# Patient Record
Sex: Female | Born: 1995 | Race: White | Hispanic: No | Marital: Single | Smoking: Never smoker
Health system: Southern US, Community
[De-identification: ages and names within clinical notes are randomized; demographics above are authoritative.]

## PROBLEM LIST (undated history)

## (undated) DIAGNOSIS — B009 Herpesviral infection, unspecified: Secondary | ICD-10-CM

## (undated) HISTORY — DX: Herpesviral infection, unspecified: B00.9

---

## 2019-04-27 ENCOUNTER — Encounter: Payer: Self-pay | Admitting: Obstetrics and Gynecology

## 2019-05-16 ENCOUNTER — Other Ambulatory Visit: Payer: Self-pay

## 2019-05-17 ENCOUNTER — Telehealth: Payer: Self-pay | Admitting: *Deleted

## 2019-05-17 ENCOUNTER — Other Ambulatory Visit (HOSPITAL_COMMUNITY)
Admission: RE | Admit: 2019-05-17 | Discharge: 2019-05-17 | Disposition: A | Discharge status: Home / Self Care | Payer: BC Managed Care – PPO | Source: Ambulatory Visit | Attending: Obstetrics and Gynecology | Admitting: Obstetrics and Gynecology

## 2019-05-17 ENCOUNTER — Encounter: Payer: Self-pay | Admitting: Obstetrics and Gynecology

## 2019-05-17 ENCOUNTER — Other Ambulatory Visit: Payer: BC Managed Care – PPO

## 2019-05-17 ENCOUNTER — Ambulatory Visit (INDEPENDENT_AMBULATORY_CARE_PROVIDER_SITE_OTHER): Payer: BC Managed Care – PPO | Admitting: Obstetrics and Gynecology

## 2019-05-17 VITALS — BP 104/68 | HR 64 | Temp 97.9°F | Ht 65.5 in | Wt 123.0 lb

## 2019-05-17 DIAGNOSIS — N6321 Unspecified lump in the left breast, upper outer quadrant: Secondary | ICD-10-CM

## 2019-05-17 DIAGNOSIS — Z30431 Encounter for routine checking of intrauterine contraceptive device: Secondary | ICD-10-CM

## 2019-05-17 DIAGNOSIS — Z23 Encounter for immunization: Secondary | ICD-10-CM

## 2019-05-17 DIAGNOSIS — Z124 Encounter for screening for malignant neoplasm of cervix: Secondary | ICD-10-CM | POA: Insufficient documentation

## 2019-05-17 DIAGNOSIS — Z113 Encounter for screening for infections with a predominantly sexual mode of transmission: Secondary | ICD-10-CM

## 2019-05-17 DIAGNOSIS — Z0000 Encounter for general adult medical examination without abnormal findings: Secondary | ICD-10-CM | POA: Diagnosis not present

## 2019-05-17 DIAGNOSIS — Z01419 Encounter for gynecological examination (general) (routine) without abnormal findings: Secondary | ICD-10-CM

## 2019-05-17 DIAGNOSIS — Z803 Family history of malignant neoplasm of breast: Secondary | ICD-10-CM

## 2019-05-17 NOTE — Telephone Encounter (Signed)
Spoke with Deanna Lopez at [facility]. Patient scheduled for left breast US on 05/19/19 at 2:30pm, arrive at 2:10pm. Patient declined earlier appt offered.   Patient placed in [facility] hold.    Patient notified of appt date and time. Patient declines appt, she will contact [facility] directly to reschedule left breast US.    Routing to provider for final review. Patient is agreeable to disposition. Will close encounter.

## 2019-05-17 NOTE — Telephone Encounter (Signed)
-----   Message from Salvadore Dom, MD sent at 05/17/2019 10:18 AM EST ----- Please set her up for a left breast ultrasound, attention 1-2 o'clock near the periphery. 2.5 cm smooth mobile lump. Thanks, Sharee Pimple

## 2019-05-17 NOTE — Patient Instructions (Signed)
EXERCISE AND DIET:  We recommended that you start or continue a regular exercise program for good health. Regular exercise means any activity that makes your heart beat faster and makes you sweat.  We recommend exercising at least 30 minutes per day at least 3 days a week, preferably 4 or 5.  We also recommend a diet low in fat and sugar.  Inactivity, poor dietary choices and obesity can cause diabetes, heart attack, stroke, and kidney damage, among others.    ALCOHOL AND SMOKING:  Women should limit their alcohol intake to no more than 7 drinks/beers/glasses of wine (combined, not each!) per week. Moderation of alcohol intake to this level decreases your risk of breast cancer and liver damage. And of course, no recreational drugs are part of a healthy lifestyle.  And absolutely no smoking or even second hand smoke. Most people know smoking can cause heart and lung diseases, but did you know it also contributes to weakening of your bones? Aging of your skin?  Yellowing of your teeth and nails?  CALCIUM AND VITAMIN D:  Adequate intake of calcium and Vitamin D are recommended.  The recommendations for exact amounts of these supplements seem to change often, but generally speaking 1,000 mg of calcium (between diet and supplement) and 800 units of Vitamin D per day seems prudent. Certain women may benefit from higher intake of Vitamin D.  If you are among these women, your doctor will have told you during your visit.    PAP SMEARS:  Pap smears, to check for cervical cancer or precancers,  have traditionally been done yearly, although recent scientific advances have shown that most women can have pap smears less often.  However, every woman still should have a physical exam from her gynecologist every year. It will include a breast check, inspection of the vulva and vagina to check for abnormal growths or skin changes, a visual exam of the cervix, and then an exam to evaluate the size and shape of the uterus and  ovaries.  And after 24 years of age, a rectal exam is indicated to check for rectal cancers. We will also provide age appropriate advice regarding health maintenance, like when you should have certain vaccines, screening for sexually transmitted diseases, bone density testing, colonoscopy, mammograms, etc.   MAMMOGRAMS:  All women over 40 years old should have a yearly mammogram. Many facilities now offer a "3D" mammogram, which may cost around $50 extra out of pocket. If possible,  we recommend you accept the option to have the 3D mammogram performed.  It both reduces the number of women who will be called back for extra views which then turn out to be normal, and it is better than the routine mammogram at detecting truly abnormal areas.    COLON CANCER SCREENING: Now recommend starting at age 45. At this time colonoscopy is not covered for routine screening until 50. There are take home tests that can be done between 45-49.   COLONOSCOPY:  Colonoscopy to screen for colon cancer is recommended for all women at age 50.  We know, you hate the idea of the prep.  We agree, BUT, having colon cancer and not knowing it is worse!!  Colon cancer so often starts as a polyp that can be seen and removed at colonscopy, which can quite literally save your life!  And if your first colonoscopy is normal and you have no family history of colon cancer, most women don't have to have it again for   10 years.  Once every ten years, you can do something that may end up saving your life, right?  We will be happy to help you get it scheduled when you are ready.  Be sure to check your insurance coverage so you understand how much it will cost.  It may be covered as a preventative service at no cost, but you should check your particular policy.      Breast Self-Awareness Breast self-awareness means being familiar with how your breasts look and feel. It involves checking your breasts regularly and reporting any changes to your  health care provider. Practicing breast self-awareness is important. A change in your breasts can be a sign of a serious medical problem. Being familiar with how your breasts look and feel allows you to find any problems early, when treatment is more likely to be successful. All women should practice breast self-awareness, including women who have had breast implants. How to do a breast self-exam One way to learn what is normal for your breasts and whether your breasts are changing is to do a breast self-exam. To do a breast self-exam: Look for Changes  1. Remove all the clothing above your waist. 2. Stand in front of a mirror in a room with good lighting. 3. Put your hands on your hips. 4. Push your hands firmly downward. 5. Compare your breasts in the mirror. Look for differences between them (asymmetry), such as: ? Differences in shape. ? Differences in size. ? Puckers, dips, and bumps in one breast and not the other. 6. Look at each breast for changes in your skin, such as: ? Redness. ? Scaly areas. 7. Look for changes in your nipples, such as: ? Discharge. ? Bleeding. ? Dimpling. ? Redness. ? A change in position. Feel for Changes Carefully feel your breasts for lumps and changes. It is best to do this while lying on your back on the floor and again while sitting or standing in the shower or tub with soapy water on your skin. Feel each breast in the following way:  Place the arm on the side of the breast you are examining above your head.  Feel your breast with the other hand.  Start in the nipple area and make  inch (2 cm) overlapping circles to feel your breast. Use the pads of your three middle fingers to do this. Apply light pressure, then medium pressure, then firm pressure. The light pressure will allow you to feel the tissue closest to the skin. The medium pressure will allow you to feel the tissue that is a little deeper. The firm pressure will allow you to feel the tissue  close to the ribs.  Continue the overlapping circles, moving downward over the breast until you feel your ribs below your breast.  Move one finger-width toward the center of the body. Continue to use the  inch (2 cm) overlapping circles to feel your breast as you move slowly up toward your collarbone.  Continue the up and down exam using all three pressures until you reach your armpit.  Write Down What You Find  Write down what is normal for each breast and any changes that you find. Keep a written record with breast changes or normal findings for each breast. By writing this information down, you do not need to depend only on memory for size, tenderness, or location. Write down where you are in your menstrual cycle, if you are still menstruating. If you are having trouble noticing differences   in your breasts, do not get discouraged. With time you will become more familiar with the variations in your breasts and more comfortable with the exam. How often should I examine my breasts? Examine your breasts every month. If you are breastfeeding, the best time to examine your breasts is after a feeding or after using a breast pump. If you menstruate, the best time to examine your breasts is 5-7 days after your period is over. During your period, your breasts are lumpier, and it may be more difficult to notice changes. When should I see my health care provider? See your health care provider if you notice:  A change in shape or size of your breasts or nipples.  A change in the skin of your breast or nipples, such as a reddened or scaly area.  Unusual discharge from your nipples.  A lump or thick area that was not there before.  Pain in your breasts.  Anything that concerns you.  

## 2019-05-17 NOTE — Progress Notes (Signed)
23 y.o. G0P0000 Single White or Caucasian Not Hispanic or Latino female here for annual exam.  She has a paragard IUD, inserted in 5/19.   Period Cycle (Days): 28 Period Duration (Days): 3-4 days Period Pattern: Regular Menstrual Flow: Heavy, Light Menstrual Control: Tampon, Thin pad Menstrual Control Change Freq (Hours): changing tampon/pad every 2 hours on heavy days, changing every 5 hours on lighter days Dysmenorrhea: (!) Mild Dysmenorrhea Symptoms: Cramping  She can saturate a super tampon in up to 2 hours.  Sexually active, no specific partner, uses condoms. No dyspareunia.  Patient's last menstrual period was 04/19/2019 (exact date).          Sexually active: Yes.    The current method of family planning is IUD.    Exercising: Yes.    running, yoga Smoker:  no  Health Maintenance: Pap:  2017 WNL per patient History of abnormal Pap:  no TDaP:  Unsure Gardasil: completed all 3   reports that she has never smoked. She has never used smokeless tobacco. She reports current alcohol use of about 3.0 standard drinks of alcohol per week. She reports that she does not use drugs. She is a Actor.   History reviewed. No pertinent past medical history.  History reviewed. No pertinent surgical history.  Current Outpatient Medications  Medication Sig Dispense Refill  . doxycycline (VIBRAMYCIN) 100 MG capsule Take 1 capsule by mouth daily.    . fexofenadine (ALLEGRA ALLERGY) 180 MG tablet Take 1 tablet by mouth daily.     No current facility-administered medications for this visit.    Family History  Problem Relation Age of Onset  . Breast cancer Maternal Grandmother   . Breast cancer Paternal Grandmother   PGM in her 107's, MGM died in her 85's. Mom and her sister are fine.   Review of Systems  Constitutional: Negative.   HENT: Negative.   Eyes: Negative.   Respiratory: Negative.   Cardiovascular: Negative.   Endocrine: Negative.   Genitourinary: Negative.    Musculoskeletal: Negative.   Skin: Negative.   Allergic/Immunologic: Negative.   Neurological: Negative.   Hematological: Negative.   Psychiatric/Behavioral: Negative.     Exam:   BP 104/68 (BP Location: Right Arm, Patient Position: Sitting, Cuff Size: Normal)   Pulse 64   Temp 97.9 F (36.6 C) (Skin)   Ht 5' 5.5" (1.664 m)   Wt 123 lb (55.8 kg)   LMP 04/19/2019 (Exact Date)   BMI 20.16 kg/m   Weight change: @WEIGHTCHANGE @ Height:   Height: 5' 5.5" (166.4 cm)  Ht Readings from Last 3 Encounters:  05/17/19 5' 5.5" (1.664 m)    General appearance: alert, cooperative and appears stated age Head: Normocephalic, without obvious abnormality, atraumatic Neck: no adenopathy, supple, symmetrical, trachea midline and thyroid normal to inspection and palpation Lungs: clear to auscultation bilaterally Cardiovascular: regular rate and rhythm Breasts: in the periphery of the left breast at 1-2 o'clock is a smooth, mobile ~2.5 cm lump. No other lumps, no skin changes. Bilateral nipple piercings  Abdomen: soft, non-tender; non distended,  no masses,  no organomegaly Extremities: extremities normal, atraumatic, no cyanosis or edema Skin: Skin color, texture, turgor normal. No rashes or lesions Lymph nodes: Cervical, supraclavicular, and axillary nodes normal. No abnormal inguinal nodes palpated Neurologic: Grossly normal   Pelvic: External genitalia:  no lesions              Urethra:  normal appearing urethra with no masses, tenderness or lesions  Bartholins and Skenes: normal                 Vagina: normal appearing vagina with normal color and discharge, no lesions              Cervix: no lesions and IUD string 5 cm               Bimanual Exam:  Uterus:  normal size, contour, position, consistency, mobility, non-tender and retroverted              Adnexa: no mass, fullness, tenderness               Rectovaginal: Confirms               Anus:  normal sphincter tone, no  lesions  Gae Dry chaperoned for the exam.  A:  Well Woman with normal exam  IUD check  Heavy cycles  Left breast lump  FH of breast cancer in both of her grandmothers, one was in her 52's  P:   Pap with GC/CT/Trich  Screening labs, STD testing  Discussed breast self exam  Discussed calcium and vit D intake  Ultrasound left breast  Recommended her mother consider speaking with a Dietitian.   Condom use encouraged  TDAP today

## 2019-05-18 LAB — CYTOLOGY - PAP
Chlamydia: NEGATIVE
Comment: NEGATIVE
Comment: NEGATIVE
Comment: NORMAL
Neisseria Gonorrhea: NEGATIVE
Trichomonas: NEGATIVE

## 2019-05-18 LAB — COMPREHENSIVE METABOLIC PANEL
ALT: 11 IU/L (ref 0–32)
AST: 13 IU/L (ref 0–40)
Albumin/Globulin Ratio: 1.9 (ref 1.2–2.2)
Albumin: 4.7 g/dL (ref 3.9–5.0)
Alkaline Phosphatase: 47 IU/L (ref 39–117)
BUN/Creatinine Ratio: 16 (ref 9–23)
BUN: 12 mg/dL (ref 6–20)
Bilirubin Total: 0.7 mg/dL (ref 0.0–1.2)
CO2: 24 mmol/L (ref 20–29)
Calcium: 9.5 mg/dL (ref 8.7–10.2)
Chloride: 102 mmol/L (ref 96–106)
Creatinine, Ser: 0.77 mg/dL (ref 0.57–1.00)
GFR calc Af Amer: 125 mL/min/{1.73_m2} (ref >59)
GFR calc non Af Amer: 108 mL/min/{1.73_m2} (ref >59)
Globulin, Total: 2.5 g/dL (ref 1.5–4.5)
Glucose: 75 mg/dL (ref 65–99)
Potassium: 3.6 mmol/L (ref 3.5–5.2)
Sodium: 139 mmol/L (ref 134–144)
Total Protein: 7.2 g/dL (ref 6.0–8.5)

## 2019-05-18 LAB — CBC
Hematocrit: 38.1 % (ref 34.0–46.6)
Hemoglobin: 13 g/dL (ref 11.1–15.9)
MCH: 32.9 pg (ref 26.6–33.0)
MCHC: 34.1 g/dL (ref 31.5–35.7)
MCV: 97 fL (ref 79–97)
Platelets: 227 10*3/uL (ref 150–450)
RBC: 3.95 x10E6/uL (ref 3.77–5.28)
RDW: 11.2 % — ABNORMAL LOW (ref 11.7–15.4)
WBC: 4.1 10*3/uL (ref 3.4–10.8)

## 2019-05-18 LAB — LIPID PANEL
Chol/HDL Ratio: 2.3 ratio (ref 0.0–4.4)
Cholesterol, Total: 130 mg/dL (ref 100–199)
HDL: 56 mg/dL (ref >39)
LDL Chol Calc (NIH): 65 mg/dL (ref 0–99)
Triglycerides: 37 mg/dL (ref 0–149)
VLDL Cholesterol Cal: 9 mg/dL (ref 5–40)

## 2019-05-18 LAB — HIV ANTIBODY (ROUTINE TESTING W REFLEX): HIV Screen 4th Generation wRfx: NONREACTIVE

## 2019-05-18 LAB — RPR, QUANT+TP ABS (REFLEX)
Rapid Plasma Reagin, Quant: 1:1 {titer} — ABNORMAL HIGH
T Pallidum Abs: NONREACTIVE

## 2019-05-18 LAB — RPR: RPR Ser Ql: REACTIVE — AB

## 2019-05-19 ENCOUNTER — Other Ambulatory Visit: Payer: BC Managed Care – PPO

## 2019-05-20 ENCOUNTER — Other Ambulatory Visit: Payer: Self-pay

## 2019-05-20 DIAGNOSIS — A530 Latent syphilis, unspecified as early or late: Secondary | ICD-10-CM

## 2019-05-20 NOTE — Progress Notes (Addendum)
RPR orders placed for repeat for false + RPR on 06/06/2019.  RPR cancelled on 05/24/19 and reordered T. Pallidum AB total to be drawn on 06/06/2019.

## 2019-05-24 NOTE — Progress Notes (Signed)
It needs to be a T. Pallidum Ab. Please cancel the rpr and order the T. Pallidum Ab

## 2019-05-24 NOTE — Addendum Note (Signed)
Addended by: Georgia Lopes on: 05/24/2019 01:19 PM   Modules accepted: Orders

## 2019-06-02 ENCOUNTER — Other Ambulatory Visit: Payer: BC Managed Care – PPO

## 2019-06-06 ENCOUNTER — Ambulatory Visit: Payer: BC Managed Care – PPO | Admitting: Obstetrics and Gynecology

## 2019-06-06 ENCOUNTER — Other Ambulatory Visit: Payer: Self-pay

## 2019-06-06 ENCOUNTER — Other Ambulatory Visit (INDEPENDENT_AMBULATORY_CARE_PROVIDER_SITE_OTHER): Payer: BC Managed Care – PPO

## 2019-06-06 ENCOUNTER — Ambulatory Visit
Admission: RE | Admit: 2019-06-06 | Discharge: 2019-06-06 | Disposition: A | Discharge status: Home / Self Care | Payer: BC Managed Care – PPO | Source: Ambulatory Visit | Attending: Obstetrics and Gynecology | Admitting: Obstetrics and Gynecology

## 2019-06-06 DIAGNOSIS — N6321 Unspecified lump in the left breast, upper outer quadrant: Secondary | ICD-10-CM

## 2019-06-06 DIAGNOSIS — A530 Latent syphilis, unspecified as early or late: Secondary | ICD-10-CM

## 2019-06-06 DIAGNOSIS — Z803 Family history of malignant neoplasm of breast: Secondary | ICD-10-CM

## 2019-06-07 LAB — T.PALLIDUM AB, TOTAL: T Pallidum Abs: NONREACTIVE

## 2019-08-17 ENCOUNTER — Other Ambulatory Visit: Payer: Self-pay

## 2019-08-17 ENCOUNTER — Ambulatory Visit: Payer: BC Managed Care – PPO | Admitting: Obstetrics and Gynecology

## 2019-08-17 ENCOUNTER — Encounter: Payer: Self-pay | Admitting: Obstetrics and Gynecology

## 2019-08-17 VITALS — BP 118/60 | HR 81 | Temp 99.3°F | Ht 65.0 in | Wt 118.2 lb

## 2019-08-17 DIAGNOSIS — Z1239 Encounter for other screening for malignant neoplasm of breast: Secondary | ICD-10-CM | POA: Diagnosis not present

## 2019-08-17 NOTE — Progress Notes (Signed)
GYNECOLOGY  VISIT   HPI: 24 y.o.   Single White or Caucasian Not Hispanic or Latino  female   G0P0000 with No LMP recorded.   here for follow up 3 month left breast check. She states that she has some breast tenderness but thinks it's because her period is about to come.  At the time of her annual exam in 12/20, she was noted to have an ~2.5 cm lump in the periphery of the left breast at 1-2 o'clock. In 1/21 she had a normal breast ultrasound.  GYNECOLOGIC HISTORY: No LMP recorded. Contraception:IUD  Menopausal hormone therapy: none        OB History    Gravida  0   Para  0   Term  0   Preterm  0   AB  0   Living  0     SAB  0   TAB  0   Ectopic  0   Multiple  0   Live Births  0              There are no problems to display for this patient.   No past medical history on file.  No past surgical history on file.  Current Outpatient Medications  Medication Sig Dispense Refill  . doxycycline (VIBRAMYCIN) 100 MG capsule Take 1 capsule by mouth daily.    . fexofenadine (ALLEGRA ALLERGY) 180 MG tablet Take 1 tablet by mouth daily.     No current facility-administered medications for this visit.     ALLERGIES: Other  Family History  Problem Relation Age of Onset  . Breast cancer Maternal Grandmother   . Breast cancer Paternal Grandmother     Social History   Socioeconomic History  . Marital status: Single    Spouse name: Not on file  . Number of children: Not on file  . Years of education: Not on file  . Highest education level: Not on file  Occupational History  . Not on file  Tobacco Use  . Smoking status: Never Smoker  . Smokeless tobacco: Never Used  Substance and Sexual Activity  . Alcohol use: Yes    Alcohol/week: 3.0 standard drinks    Types: 3 Standard drinks or equivalent per week  . Drug use: Never  . Sexual activity: Yes    Birth control/protection: Condom, I.U.D.    Comment: Paragard IUD, inserted 09/2017  Other Topics Concern   . Not on file  Social History Narrative  . Not on file   Social Determinants of Health   Financial Resource Strain:   . Difficulty of Paying Living Expenses:   Food Insecurity:   . Worried About Programme researcher, broadcasting/film/video in the Last Year:   . Barista in the Last Year:   Transportation Needs:   . Freight forwarder (Medical):   Marland Kitchen Lack of Transportation (Non-Medical):   Physical Activity:   . Days of Exercise per Week:   . Minutes of Exercise per Session:   Stress:   . Feeling of Stress :   Social Connections:   . Frequency of Communication with Friends and Family:   . Frequency of Social Gatherings with Friends and Family:   . Attends Religious Services:   . Active Member of Clubs or Organizations:   . Attends Banker Meetings:   Marland Kitchen Marital Status:   Intimate Partner Violence:   . Fear of Current or Ex-Partner:   . Emotionally Abused:   Marland Kitchen Physically Abused:   .  Sexually Abused:     Review of Systems  Constitutional: Negative.   HENT: Negative.   Eyes: Negative.   Respiratory: Negative.   Cardiovascular: Negative.   Gastrointestinal: Negative.   Genitourinary: Negative.   Musculoskeletal: Negative.   Skin: Negative.   Neurological: Negative.   Endo/Heme/Allergies: Negative.   Psychiatric/Behavioral: Negative.   All other systems reviewed and are negative.   PHYSICAL EXAMINATION:    There were no vitals taken for this visit.    General appearance: alert, cooperative and appears stated age Breasts: normal appearance, in the upper outer quadrant of the left breast is more prominant fibroius tissue ~2.5 cm in size not felt on the other side. No change. No adenopathy, no skin changes.  ASSESSMENT Breast check, stable exam, normal imaging     PLAN Continue monthly breast self exams F/U in 12/21 for an annual exam   An After Visit Summary was printed and given to the patient.

## 2019-10-17 ENCOUNTER — Telehealth: Payer: Self-pay

## 2019-10-17 NOTE — Telephone Encounter (Signed)
AEX 05/2019 Paragard IUD  LMP 10/17/19 Recent frequent UTIs  Spoke with pt. Pt states having UTI sx that started today with LMP. Pt states having frequency, urgency, burning with urination and pain. Pt denies fever, chills, back pain, vaginal discharge or odor. Pt denies taking any OTC meds for treatment or pain. Advised can take Motrin or Tylenol for discomfort. Pt agreeable. Pt states [facility] and was given [facility]bid Rx and finished taking x 1 week ago.  Pt advised to be seen for further evaluation. Pt agreeable. Pt scheduled with Dr Oscar La 10/18/19 at 1:30 pm per pt's request of date and time due to work schedule. Pt agreeable and verbalized understanding. Pt advised can be seen at urgent care or PCP. Pt declines and will wait for OV.   Routing to Dr Oscar La for review.  Encounter closed.

## 2019-10-17 NOTE — Telephone Encounter (Signed)
Patient is calling in regards to a UTI. Patient stated she had a UTI 2 weeks ago and [facility]. Patient states she had completed the antibiotics but has started her cycle today and the UTI has returned. Patient stated it was very painful and would like to come in. Need triage to assist.

## 2019-10-18 ENCOUNTER — Other Ambulatory Visit: Payer: Self-pay

## 2019-10-18 ENCOUNTER — Ambulatory Visit: Payer: BC Managed Care – PPO | Admitting: Obstetrics and Gynecology

## 2019-10-18 ENCOUNTER — Encounter: Payer: Self-pay | Admitting: Obstetrics and Gynecology

## 2019-10-18 VITALS — BP 90/58 | HR 73 | Temp 98.4°F | Ht 65.0 in | Wt 115.0 lb

## 2019-10-18 DIAGNOSIS — R300 Dysuria: Secondary | ICD-10-CM

## 2019-10-18 DIAGNOSIS — N3090 Cystitis, unspecified without hematuria: Secondary | ICD-10-CM | POA: Diagnosis not present

## 2019-10-18 LAB — POCT URINALYSIS DIPSTICK
Bilirubin, UA: NEGATIVE
Glucose, UA: NEGATIVE
Ketones, UA: NEGATIVE
Nitrite, UA: NEGATIVE
Protein, UA: POSITIVE — AB
Urobilinogen, UA: NEGATIVE E.U./dL — AB
pH, UA: 7 (ref 5.0–8.0)

## 2019-10-18 MED ORDER — PHENAZOPYRIDINE HCL 200 MG PO TABS: 200.0000 mg | ORAL_TABLET | Freq: Three times a day (TID) | ORAL | 0 refills | Status: DC | PRN

## 2019-10-18 MED ORDER — SULFAMETHOXAZOLE-TRIMETHOPRIM 800-160 MG PO TABS: 1.0000 | ORAL_TABLET | Freq: Two times a day (BID) | ORAL | 0 refills | Status: DC

## 2019-10-18 NOTE — Patient Instructions (Signed)

## 2019-10-18 NOTE — Progress Notes (Signed)
GYNECOLOGY  VISIT   HPI: 24 y.o.   Single White or Caucasian Not Hispanic or Latino  female   G0P0000 with No LMP recorded.   here for dysuria frequency. Patient states that her symptoms have been present since yesterday AM.   She c/o frequency, urgency and dysuria since yesterday. No fever, no back pain. Voiding small amounts.  She was treated for a UTI on 10/01/19 at urgent care, symptoms did resolve, culture returned as negative.   Sexually active, same partner since December, not using condoms.    GYNECOLOGIC HISTORY: No LMP recorded. Contraception: IUD Menopausal hormone therapy: none         OB History    Gravida  0   Para  0   Term  0   Preterm  0   AB  0   Living  0     SAB  0   TAB  0   Ectopic  0   Multiple  0   Live Births  0              There are no problems to display for this patient.   No past medical history on file.  No past surgical history on file.  Current Outpatient Medications  Medication Sig Dispense Refill  . doxycycline (VIBRAMYCIN) 100 MG capsule Take 1 capsule by mouth daily.    . fexofenadine (ALLEGRA ALLERGY) 180 MG tablet Take 1 tablet by mouth daily.     No current facility-administered medications for this visit.     ALLERGIES: Other  Family History  Problem Relation Age of Onset  . Breast cancer Maternal Grandmother   . Breast cancer Paternal Grandmother     Social History   Socioeconomic History  . Marital status: Single    Spouse name: Not on file  . Number of children: Not on file  . Years of education: Not on file  . Highest education level: Not on file  Occupational History  . Not on file  Tobacco Use  . Smoking status: Never Smoker  . Smokeless tobacco: Never Used  Substance and Sexual Activity  . Alcohol use: Yes    Alcohol/week: 3.0 standard drinks    Types: 3 Standard drinks or equivalent per week  . Drug use: Never  . Sexual activity: Yes    Birth control/protection: Condom, I.U.D.   Comment: Paragard IUD, inserted 09/2017  Other Topics Concern  . Not on file  Social History Narrative  . Not on file   Social Determinants of Health   Financial Resource Strain:   . Difficulty of Paying Living Expenses:   Food Insecurity:   . Worried About Programme researcher, broadcasting/film/video in the Last Year:   . Barista in the Last Year:   Transportation Needs:   . Freight forwarder (Medical):   Marland Kitchen Lack of Transportation (Non-Medical):   Physical Activity:   . Days of Exercise per Week:   . Minutes of Exercise per Session:   Stress:   . Feeling of Stress :   Social Connections:   . Frequency of Communication with Friends and Family:   . Frequency of Social Gatherings with Friends and Family:   . Attends Religious Services:   . Active Member of Clubs or Organizations:   . Attends Banker Meetings:   Marland Kitchen Marital Status:   Intimate Partner Violence:   . Fear of Current or Ex-Partner:   . Emotionally Abused:   Marland Kitchen Physically Abused:   .  Sexually Abused:     Review of Systems  Genitourinary: Positive for dysuria, frequency and urgency.  All other systems reviewed and are negative.   PHYSICAL EXAMINATION:    BP (!) 90/58   Pulse 73   Temp 98.4 F (36.9 C)   Ht 5\' 5"  (1.651 m)   Wt 115 lb (52.2 kg)   SpO2 99%   BMI 19.14 kg/m     General appearance: alert, cooperative and appears stated age Abdomen: soft, mild suprapubic tenderness; non distended, no masses,  no organomegaly CVA: not tender   Urine dip: moderate blood, 2+Leuk  ASSESSMENT Cystitis    PLAN Treat with bactrim and pyridium Send urine for ua, c&s We discussed STD testing, she declines at this time.    An After Visit Summary was printed and given to the patient.

## 2019-10-19 LAB — URINALYSIS, MICROSCOPIC ONLY
Bacteria, UA: NONE SEEN
Casts: NONE SEEN /lpf
RBC, Urine: >30 /hpf — AB (ref 0–2)

## 2019-10-20 ENCOUNTER — Telehealth: Payer: Self-pay

## 2019-10-20 LAB — URINE CULTURE

## 2019-10-20 NOTE — Telephone Encounter (Signed)
Spoke with pt. Pt given results on labs per Dr Oscar La. Pt agreeable and verbalized understanding.  Pt states feeling much better today after first two days of Bactrim DS Rx.   Will not send in new Rx for Amoxicillin. A Routing to Dr Oscar La for review. Encounter closed.

## 2019-10-20 NOTE — Telephone Encounter (Signed)
-----   Message from Romualdo Bolk, MD sent at 10/20/2019  3:45 PM EDT ----- Please let the patient know that she does have a UTI, 2 bacteria are identified. E coli is likely sensitive to Bactrim, but those results are still pending. The GBS could be sensitive to the Bactrim, but would definitely be sensitive to Amoxicillin 500 mg q 8 hours x 5 days. Unless she is feeling fine, please send that in. Please check on the final results tomorrow

## 2019-10-24 ENCOUNTER — Telehealth: Payer: Self-pay | Admitting: Obstetrics and Gynecology

## 2019-10-24 NOTE — Telephone Encounter (Signed)
Spoke with patient. Patient seen in office on 5/18/211 for UTI, tx with bactrim DS bid x3 days. Patient reports when she was notified of results she was feeling better. Patient reports she completed abx on 10/21/19, reports still feeling discomfort. Denies urinary frequency, urgency, dysuria, flank pain, fever/chills. Patient is requesting additional abx.   Advised patient I willl provide update to Dr. Oscar La and notify of recommendations. Advised patient Dr. Oscar La is out of the office today, response may not be immediate. Patient agreeable. Pharmacy confirmed.   Reviewed 10/18/19 UC results. Rx pended.   Dr. Oscar La -please advise on Amoxicillin 500 mg q8 hrs x5 days.

## 2019-10-24 NOTE — Telephone Encounter (Signed)
Patient was seen 10/18/19 and is still having uti symptoms.

## 2019-10-25 MED ORDER — AMOXICILLIN 500 MG PO CAPS: 500.0000 mg | ORAL_CAPSULE | Freq: Three times a day (TID) | ORAL | 0 refills | Status: AC

## 2019-10-25 NOTE — Telephone Encounter (Signed)
Call to patient, left detailed message, ok per dpr, name identified on voicemail. Advised per Dr. Oscar La. Rx for amoxicillin 500 mg caps take 1 cap PO q8hrs for 5 days,  has been sent to [facility] on [address]. Return call to office if any additional questions.   Encounter closed.

## 2019-10-25 NOTE — Telephone Encounter (Signed)
Refill sent. Please let the patient know. I would also have her stop her doxycycline while taking the amoxicillin,it can decrease the effectiveness.

## 2019-11-03 ENCOUNTER — Telehealth: Payer: Self-pay | Admitting: Obstetrics and Gynecology

## 2019-11-03 NOTE — Telephone Encounter (Signed)
Spoke back with pt after reviewing with Dr Oscar La. Pt agreeable to OV. Pt states cannot come today due to working until 8 pm tonight. Pt states has off tomorrow 6/4 and aware that Dr Oscar La not in office. Pt advised can be seen by other provider or PCP. Pt states ok to see other provider in our office due to not having PCP. Pt scheduled as work in appt with Dr Edward Jolly 6/4 at 1115 am. Pt agreeable and verbalized understanding.   Routing to Dr Edward Jolly for review.  Cc: Dr Oscar La for [facility]  Encounter closed.

## 2019-11-03 NOTE — Telephone Encounter (Signed)
Reviewed with Dr Oscar La, pt advised to have OV.   Left message for pt to return call to triage RN.

## 2019-11-03 NOTE — Telephone Encounter (Signed)
Patient was treated a few weeks ago for a uti and her symptoms have returned.

## 2019-11-03 NOTE — Telephone Encounter (Signed)
Spoke with pt. Pt seen in office on 10/18/19 for UTI. +UC for E Coli and GBS on 5/20 and was treated with Bactrim DS and then had additional abx of Amoxicillin on 10/25/19 and finished taking on 10/29/19.  Pt states waking up this am and feels like UTI has returned with discomfort, urgency and burning when urinating. Pt states has not taken any OTC meds. Pt denies any frequency, flank pain, dysuria, fever, chills. Pt states was SA x 1 week ago and no problems after. Pt states only drinking 16 oz of water today.   Advised pt will review with Dr Oscar La and get recommendations and return call to pt. Pt agreeable.   Routing to Dr Oscar La

## 2019-11-04 ENCOUNTER — Encounter: Payer: Self-pay | Admitting: Obstetrics and Gynecology

## 2019-11-04 ENCOUNTER — Other Ambulatory Visit: Payer: Self-pay

## 2019-11-04 ENCOUNTER — Ambulatory Visit: Payer: BC Managed Care – PPO | Admitting: Obstetrics and Gynecology

## 2019-11-04 ENCOUNTER — Other Ambulatory Visit (HOSPITAL_COMMUNITY)
Admission: RE | Admit: 2019-11-04 | Discharge: 2019-11-04 | Disposition: A | Discharge status: Home / Self Care | Payer: BC Managed Care – PPO | Source: Ambulatory Visit | Attending: Obstetrics and Gynecology | Admitting: Obstetrics and Gynecology

## 2019-11-04 VITALS — BP 110/70 | HR 55 | Temp 97.4°F | Ht 65.0 in | Wt 112.0 lb

## 2019-11-04 DIAGNOSIS — R8290 Unspecified abnormal findings in urine: Secondary | ICD-10-CM

## 2019-11-04 DIAGNOSIS — Z113 Encounter for screening for infections with a predominantly sexual mode of transmission: Secondary | ICD-10-CM | POA: Insufficient documentation

## 2019-11-04 DIAGNOSIS — R350 Frequency of micturition: Secondary | ICD-10-CM

## 2019-11-04 LAB — POCT URINALYSIS DIPSTICK
Bilirubin, UA: NEGATIVE
Glucose, UA: NEGATIVE
Ketones, UA: NEGATIVE
Nitrite, UA: NEGATIVE
Protein, UA: NEGATIVE
Urobilinogen, UA: 0.2 E.U./dL
pH, UA: 6 (ref 5.0–8.0)

## 2019-11-04 MED ORDER — NITROFURANTOIN MONOHYD MACRO 100 MG PO CAPS
100.0000 mg | ORAL_CAPSULE | Freq: Two times a day (BID) | ORAL | 0 refills | Status: DC
Start: 2019-11-04 — End: 2019-11-22

## 2019-11-04 NOTE — Patient Instructions (Signed)
Urinary Tract Infection, Adult A urinary tract infection (UTI) is an infection of any part of the urinary tract. The urinary tract includes:  The kidneys.  The ureters.  The bladder.  The urethra. These organs make, store, and get rid of pee (urine) in the body. What are the causes? This is caused by germs (bacteria) in your genital area. These germs grow and cause swelling (inflammation) of your urinary tract. What increases the risk? You are more likely to develop this condition if:  You have a small, thin tube (catheter) to drain pee.  You cannot control when you pee or poop (incontinence).  You are female, and: ? You use these methods to prevent pregnancy:  A medicine that kills sperm (spermicide).  A device that blocks sperm (diaphragm). ? You have low levels of a female hormone (estrogen). ? You are pregnant.  You have genes that add to your risk.  You are sexually active.  You take antibiotic medicines.  You have trouble peeing because of: ? A prostate that is bigger than normal, if you are female. ? A blockage in the part of your body that drains pee from the bladder (urethra). ? A kidney stone. ? A nerve condition that affects your bladder (neurogenic bladder). ? Not getting enough to drink. ? Not peeing often enough.  You have other conditions, such as: ? Diabetes. ? A weak disease-fighting system (immune system). ? Sickle cell disease. ? Gout. ? Injury of the spine. What are the signs or symptoms? Symptoms of this condition include:  Needing to pee right away (urgently).  Peeing often.  Peeing small amounts often.  Pain or burning when peeing.  Blood in the pee.  Pee that smells bad or not like normal.  Trouble peeing.  Pee that is cloudy.  Fluid coming from the vagina, if you are female.  Pain in the belly or lower back. Other symptoms include:  Throwing up (vomiting).  No urge to eat.  Feeling mixed up (confused).  Being tired  and grouchy (irritable).  A fever.  Watery poop (diarrhea). How is this treated? This condition may be treated with:  Antibiotic medicine.  Other medicines.  Drinking enough water. Follow these instructions at home:  Medicines  Take over-the-counter and prescription medicines only as told by your doctor.  If you were prescribed an antibiotic medicine, take it as told by your doctor. Do not stop taking it even if you start to feel better. General instructions  Make sure you: ? Pee until your bladder is empty. ? Do not hold pee for a long time. ? Empty your bladder after sex. ? Wipe from front to back after pooping if you are a female. Use each tissue one time when you wipe.  Drink enough fluid to keep your pee pale yellow.  Keep all follow-up visits as told by your doctor. This is important. Contact a doctor if:  You do not get better after 1-2 days.  Your symptoms go away and then come back. Get help right away if:  You have very bad back pain.  You have very bad pain in your lower belly.  You have a fever.  You are sick to your stomach (nauseous).  You are throwing up. Summary  A urinary tract infection (UTI) is an infection of any part of the urinary tract.  This condition is caused by germs in your genital area.  There are many risk factors for a UTI. These include having a small, thin   tube to drain pee and not being able to control when you pee or poop.  Treatment includes antibiotic medicines for germs.  Drink enough fluid to keep your pee pale yellow. This information is not intended to replace advice given to you by your health care provider. Make sure you discuss any questions you have with your health care provider. Document Revised: 05/06/2018 Document Reviewed: 11/26/2017 Elsevier Patient Education  2020 Elsevier Inc.  

## 2019-11-04 NOTE — Progress Notes (Signed)
GYNECOLOGY  VISIT   HPI: 24 y.o.   Single  Caucasian  female   G0P0000 with Patient's last menstrual period was 10/17/2019.   here for urinary frequency and urgency.   Having burning, irritation, and urgency.  Symptoms onset after intercourse. No vaginal discharge, itching, or odor.   Treated for E Coli and GBS UTI from 10/18/19 UC using Bactrim and Amoxicillin.  This followed treatment with Macro[facility] at [facility].  That UC was negative. Prior UTI was around Thanksgiving.   Sometimes intercourse prompts the infection.   No new partner.   Urine Dip: 1+WBCs, 1+RBCs  GYNECOLOGIC HISTORY: Patient's last menstrual period was 10/17/2019. Contraception: Paragard IUD 5/19 Menopausal hormone therapy: n/a Last mammogram:  n/a Last pap smear: 05-17-19 LSIL, 2017 normal per patient         OB History    Gravida  0   Para  0   Term  0   Preterm  0   AB  0   Living  0     SAB  0   TAB  0   Ectopic  0   Multiple  0   Live Births  0              There are no problems to display for this patient.   History reviewed. No pertinent past medical history.  History reviewed. No pertinent surgical history.  Current Outpatient Medications  Medication Sig Dispense Refill  . fexofenadine (ALLEGRA ALLERGY) 180 MG tablet Take 1 tablet by mouth daily.    . phenazopyridine (PYRIDIUM) 200 MG tablet Take 1 tablet (200 mg total) by mouth 3 (three) times daily as needed. (Patient not taking: Reported on 11/04/2019) 6 tablet 0   No current facility-administered medications for this visit.     ALLERGIES: Other  Family History  Problem Relation Age of Onset  . Breast cancer Maternal Grandmother   . Breast cancer Paternal Grandmother     Social History   Socioeconomic History  . Marital status: Single    Spouse name: Not on file  . Number of children: Not on file  . Years of education: Not on file  . Highest education level: Not on file  Occupational History  .  Not on file  Tobacco Use  . Smoking status: Never Smoker  . Smokeless tobacco: Never Used  Substance and Sexual Activity  . Alcohol use: Yes    Alcohol/week: 3.0 standard drinks    Types: 3 Standard drinks or equivalent per week  . Drug use: Never  . Sexual activity: Yes    Birth control/protection: Condom, I.U.D.    Comment: Paragard IUD, inserted 09/2017  Other Topics Concern  . Not on file  Social History Narrative  . Not on file   Social Determinants of Health   Financial Resource Strain:   . Difficulty of Paying Living Expenses:   Food Insecurity:   . Worried About Charity fundraiser in the Last Year:   . Arboriculturist in the Last Year:   Transportation Needs:   . Film/video editor (Medical):   Marland Kitchen Lack of Transportation (Non-Medical):   Physical Activity:   . Days of Exercise per Week:   . Minutes of Exercise per Session:   Stress:   . Feeling of Stress :   Social Connections:   . Frequency of Communication with Friends and Family:   . Frequency of Social Gatherings with Friends and Family:   . Attends  Religious Services:   . Active Member of Clubs or Organizations:   . Attends Banker Meetings:   Marland Kitchen Marital Status:   Intimate Partner Violence:   . Fear of Current or Ex-Partner:   . Emotionally Abused:   Marland Kitchen Physically Abused:   . Sexually Abused:     Review of Systems  Genitourinary: Positive for frequency and urgency.  All other systems reviewed and are negative.   PHYSICAL EXAMINATION:    BP 110/70   Pulse (!) 55   Temp (!) 97.4 F (36.3 C) (Temporal)   Ht 5\' 5"  (1.651 m)   Wt 112 lb (50.8 kg)   LMP 10/17/2019   BMI 18.64 kg/m     General appearance: alert, cooperative and appears stated age   Pelvic: External genitalia:  no lesions              Urethra:  normal appearing urethra with no masses, tenderness or lesions              Bartholins and Skenes: normal                 Vagina: normal appearing vagina with normal color  and discharge, no lesions              Cervix: no lesions.  IUD strings present.  Not initially visible but then palpable.  Repeat speculum exam revealed the IUD strings.                 Bimanual Exam:  Uterus:  normal size, contour, position, consistency, mobility, non-tender              Adnexa: no mass, fullness, tenderness            Chaperone was present for exam.  ASSESSMENT  ParaGard IUD.  Recurrent urinary tract infection.  Some postcoital infections.   PLAN  Urine micro and culture.  Macrobid 100 mg po bid x 7 days.  GC/CT/trich testing.  We discussed potential post coital abx prophylaxis.  FU with Dr. 10/19/2019 in 2 weeks.    An After Visit Summary was printed and given to the patient.  __15____ minutes face to face time of which over 50% was spent in counseling.

## 2019-11-05 LAB — URINALYSIS, MICROSCOPIC ONLY
Bacteria, UA: NONE SEEN
Casts: NONE SEEN /lpf
WBC, UA: >30 /hpf — AB (ref 0–5)

## 2019-11-07 LAB — CERVICOVAGINAL ANCILLARY ONLY
Chlamydia: NEGATIVE
Comment: NEGATIVE
Comment: NEGATIVE
Comment: NORMAL
Neisseria Gonorrhea: NEGATIVE
Trichomonas: NEGATIVE

## 2019-11-07 LAB — URINE CULTURE

## 2019-11-18 ENCOUNTER — Other Ambulatory Visit: Payer: Self-pay

## 2019-11-22 ENCOUNTER — Other Ambulatory Visit: Payer: Self-pay

## 2019-11-22 ENCOUNTER — Encounter: Payer: Self-pay | Admitting: Obstetrics and Gynecology

## 2019-11-22 ENCOUNTER — Ambulatory Visit: Payer: BC Managed Care – PPO | Admitting: Obstetrics and Gynecology

## 2019-11-22 VITALS — BP 90/60 | HR 65 | Temp 98.4°F | Ht 65.0 in | Wt 114.0 lb

## 2019-11-22 DIAGNOSIS — N390 Urinary tract infection, site not specified: Secondary | ICD-10-CM | POA: Diagnosis not present

## 2019-11-22 DIAGNOSIS — R300 Dysuria: Secondary | ICD-10-CM | POA: Diagnosis not present

## 2019-11-22 MED ORDER — NITROFURANTOIN MACROCRYSTAL 50 MG PO CAPS
ORAL_CAPSULE | ORAL | 2 refills | Status: DC
Start: 2019-11-22 — End: 2021-06-17

## 2019-11-22 NOTE — Patient Instructions (Signed)

## 2019-11-22 NOTE — Progress Notes (Signed)
GYNECOLOGY  VISIT   HPI: 24 y.o.   Single White or Caucasian Not Hispanic or Latino  female   G0P0000 with Patient's last menstrual period was 11/17/2019.   here for follow up for frequent urination. Patient states that she is not having the frequency but is still having they burning with urination.     Treated for E Coli and GBS UTI from 10/18/19 UC using Bactrim and Amoxicillin.  This followed treatment [facility].  That UC was negative. Prior UTI was around Thanksgiving.  She was seen again on 11/04/19 with persistent symptoms and treated with macrobid x 7 days. That urine did grow out e coli. She feels 90% better. No frequency or urgency. She is now having intermittent dysuria, it hurts at the end of her urinary stream and the discomfort can last 30 minutes (down from hours). The pain is a 5/10 in severity, down from a 10/10 in severity. The burning is now 1 x a day. Slowly getting better.  She feels like her bladder infections occur with intercourse.  No vaginal symptoms, no external burning, no consistent flank pain, no abdominal pain, no fevers.  She didn't drink caffeine for a week, has restarted.     She had negative testing for GC/CT/trich  GYNECOLOGIC HISTORY: Patient's last menstrual period was 11/17/2019. Contraception:IUD Menopausal hormone therapy: none        OB History    Gravida  0   Para  0   Term  0   Preterm  0   AB  0   Living  0     SAB  0   TAB  0   Ectopic  0   Multiple  0   Live Births  0              There are no problems to display for this patient.   No past medical history on file.  No past surgical history on file.  Current Outpatient Medications  Medication Sig Dispense Refill  . fexofenadine (ALLEGRA ALLERGY) 180 MG tablet Take 1 tablet by mouth daily.     No current facility-administered medications for this visit.     ALLERGIES: Patient has no active allergies.  Family History  Problem  Relation Age of Onset  . Breast cancer Maternal Grandmother   . Breast cancer Paternal Grandmother     Social History   Socioeconomic History  . Marital status: Single    Spouse name: Not on file  . Number of children: Not on file  . Years of education: Not on file  . Highest education level: Not on file  Occupational History  . Not on file  Tobacco Use  . Smoking status: Never Smoker  . Smokeless tobacco: Never Used  Vaping Use  . Vaping Use: Never used  Substance and Sexual Activity  . Alcohol use: Yes    Alcohol/week: 3.0 standard drinks    Types: 3 Standard drinks or equivalent per week  . Drug use: Never  . Sexual activity: Yes    Birth control/protection: Condom, I.U.D.    Comment: Paragard IUD, inserted 09/2017  Other Topics Concern  . Not on file  Social History Narrative  . Not on file   Social Determinants of Health   Financial Resource Strain:   . Difficulty of Paying Living Expenses:   Food Insecurity:   . Worried About Programme researcher, broadcasting/film/video in the Last Year:   . [employer]  in the Last Year:   Transportation Needs:   . Film/video editor (Medical):   Marland Kitchen Lack of Transportation (Non-Medical):   Physical Activity:   . Days of Exercise per Week:   . Minutes of Exercise per Session:   Stress:   . Feeling of Stress :   Social Connections:   . Frequency of Communication with Friends and Family:   . Frequency of Social Gatherings with Friends and Family:   . Attends Religious Services:   . Active Member of Clubs or Organizations:   . Attends Archivist Meetings:   Marland Kitchen Marital Status:   Intimate Partner Violence:   . Fear of Current or Ex-Partner:   . Emotionally Abused:   Marland Kitchen Physically Abused:   . Sexually Abused:     Review of Systems  Genitourinary: Positive for dysuria.  All other systems reviewed and are negative.   PHYSICAL EXAMINATION:    BP 90/60   Pulse 65   Temp 98.4 F (36.9 C)   Ht 5\' 5"  (1.651 m)   Wt 114 lb (51.7  kg)   LMP 11/17/2019   SpO2 99%   BMI 18.97 kg/m     General appearance: alert, cooperative and appears stated age Abdomen: soft, minimally tender in the periumbilical region, no SP tenderness; non distended, no masses,  no organomegaly CVA: not tender  Urine dip: negative  ASSESSMENT Recurrent UTI Current mild intermittent dysuria, continuing to improve since being treated for a UTI earlier this week. Urine dip is negative    PLAN Send urine for ua, c&s Hydrate well Will hold on antibiotics until her culture returns (unless her symptoms worsen) Will treat with prophylactic antibiotics with intercourse

## 2019-11-23 LAB — URINALYSIS, MICROSCOPIC ONLY
Bacteria, UA: NONE SEEN
Casts: NONE SEEN /lpf
RBC, Urine: NONE SEEN /hpf (ref 0–2)
WBC, UA: NONE SEEN /hpf (ref 0–5)

## 2019-11-24 LAB — URINE CULTURE

## 2020-02-14 ENCOUNTER — Other Ambulatory Visit: Payer: BC Managed Care – PPO

## 2020-02-14 ENCOUNTER — Other Ambulatory Visit: Payer: Self-pay

## 2020-02-14 DIAGNOSIS — Z20822 Contact with and (suspected) exposure to covid-19: Secondary | ICD-10-CM

## 2020-02-16 LAB — SARS-COV-2, NAA 2 DAY TAT

## 2020-02-16 LAB — NOVEL CORONAVIRUS, NAA: SARS-CoV-2, NAA: NOT DETECTED

## 2020-05-22 NOTE — Progress Notes (Signed)
24 y.o. G0P0000 Single White or Caucasian female here for annual exam.     During sex having a urgency to urinate. She has noticed this in the last 4 months. She does not have incontinence during sex but,  has to stop and urinate during sex about 1 time out of 4. Has tried to urinate before sex and also after. Does not have any urgency outside of sex. Urinates about 5 times a day. She is not having problems with UTI symptoms since taking Macrobid after sex, Takes Macrobid about once every 2 weeks.  Declines std testing  Boyfriend feels iud strings sometimes. She like the Paragard, no problems otherwise  Patient's last menstrual period was 05/02/2020 (exact date).          Sexually active: Yes.    The current method of family planning is IUD. paragard inserted 5/19    Exercising: Yes.    running & yoga Smoker:  no  Health Maintenance: Pap:  05-17-2019 LGSIL History of abnormal Pap:  yes MMG:  06-06-2019 left breast u/s birads 1:neg Colonoscopy:  none BMD:   none TDaP:  2020 Gardasil:   completed Covid-19: pfizer Hep C testing: not done    reports that she has never smoked. She has never used smokeless tobacco. She reports current alcohol use of about 3.0 standard drinks of alcohol per week. She reports that she does not use drugs.  History reviewed. No pertinent past medical history.  History reviewed. No pertinent surgical history.  Current Outpatient Medications  Medication Sig Dispense Refill   fexofenadine (ALLEGRA) 180 MG tablet Take 1 tablet by mouth daily.     nitrofurantoin (MACRODANTIN) 50 MG capsule Take one tablet po prn intercourse. 30 capsule 2   No current facility-administered medications for this visit.    Family History  Problem Relation Age of Onset   Breast cancer Maternal Grandmother    Breast cancer Paternal Grandmother     Review of Systems  Constitutional: Negative.   HENT: Negative.   Eyes: Negative.   Respiratory: Negative.    Cardiovascular: Negative.   Gastrointestinal: Negative.   Endocrine: Negative.   Genitourinary:       Occ discomfort during intercourse  Musculoskeletal: Negative.   Skin: Negative.   Allergic/Immunologic: Negative.   Neurological: Negative.   Hematological: Negative.   Psychiatric/Behavioral: Negative.     Exam:   BP 118/70    Pulse 68    Resp 16    Ht 5' 6.25" (1.683 m)    Wt 112 lb (50.8 kg)    LMP 05/02/2020 (Exact Date)    BMI 17.94 kg/m   Height: 5' 6.25" (168.3 cm)  General appearance: alert, cooperative and appears stated age, no acute distress Head: Normocephalic, without obvious abnormality Neck: no adenopathy, thyroid normal to inspection and palpation Lungs: clear to auscultation bilaterally Breasts: normal appearance, no masses or tenderness, mildy fibrocystic Heart: regular rate and rhythm Abdomen: soft, non-tender; no masses,  no organomegaly Extremities: extremities normal, no edema Skin: No rashes or lesions Lymph nodes: Cervical, supraclavicular, and axillary nodes normal. No abnormal inguinal nodes palpated Neurologic: Grossly normal   Pelvic: External genitalia:  no lesions              Urethra:  normal appearing urethra with no masses, tenderness or lesions              Bartholins and Skenes: normal  Vagina: normal appearing vagina, appropriate for age, bloody, no lesions              Cervix: neg cervical motion tenderness, no visible lesions, strings visible, long at 5cm, trimmed to 2.5 cm             Bimanual Exam:   Uterus:  normal size, contour, position, consistency, mobility, non-tender              Adnexa: no mass, fullness, tenderness                 Ladona Ridgel, CMA Chaperone was present for exam.  A:  Well Woman with normal exam  IUD check, strings trimmed  LGSIL last year  Bladder urgency during sex, Hx bladder infections (uses macrobid after sex)   P:   Pap :collected today, follow up pending results  Labs: Urine culture  sent today, UA normal  Medications: discussed use of OTC oxybutinin or azo during sex to see if it helps. Advised that will research some more about urgency during intercourse and get back to her when I notify about urine culture results.

## 2020-05-23 ENCOUNTER — Ambulatory Visit: Payer: BC Managed Care – PPO | Admitting: Nurse Practitioner

## 2020-05-23 ENCOUNTER — Other Ambulatory Visit: Payer: Self-pay

## 2020-05-23 ENCOUNTER — Other Ambulatory Visit (HOSPITAL_COMMUNITY)
Admission: RE | Admit: 2020-05-23 | Discharge: 2020-05-23 | Disposition: A | Discharge status: Home / Self Care | Payer: BC Managed Care – PPO | Source: Ambulatory Visit | Attending: Obstetrics and Gynecology | Admitting: Obstetrics and Gynecology

## 2020-05-23 ENCOUNTER — Encounter: Payer: Self-pay | Admitting: Nurse Practitioner

## 2020-05-23 VITALS — BP 118/70 | HR 68 | Resp 16 | Ht 66.25 in | Wt 112.0 lb

## 2020-05-23 DIAGNOSIS — Z01419 Encounter for gynecological examination (general) (routine) without abnormal findings: Secondary | ICD-10-CM | POA: Insufficient documentation

## 2020-05-23 DIAGNOSIS — R3915 Urgency of urination: Secondary | ICD-10-CM | POA: Diagnosis not present

## 2020-05-23 NOTE — Patient Instructions (Addendum)
We will send a urine culture to confirm that you do not have an underlying bladder infection. You may want to try azo or oxybutinin OTC before sex. I am going to do more research on this and send you a message with the urine culture results  Health Maintenance, Female Adopting a healthy lifestyle and getting preventive care are important in promoting health and wellness. Ask your health care provider about:  The right schedule for you to have regular tests and exams.  Things you can do on your own to prevent diseases and keep yourself healthy. What should I know about diet, weight, and exercise? Eat a healthy diet   Eat a diet that includes plenty of vegetables, fruits, low-fat dairy products, and lean protein.  Do not eat a lot of foods that are high in solid fats, added sugars, or sodium. Maintain a healthy weight Body mass index (BMI) is used to identify weight problems. It estimates body fat based on height and weight. Your health care provider can help determine your BMI and help you achieve or maintain a healthy weight. Get regular exercise Get regular exercise. This is one of the most important things you can do for your health. Most adults should:  Exercise for at least 150 minutes each week. The exercise should increase your heart rate and make you sweat (moderate-intensity exercise).  Do strengthening exercises at least twice a week. This is in addition to the moderate-intensity exercise.  Spend less time sitting. Even light physical activity can be beneficial. Watch cholesterol and blood lipids Have your blood tested for lipids and cholesterol at 25 years of age, then have this test every 5 years. Have your cholesterol levels checked more often if:  Your lipid or cholesterol levels are high.  You are older than 25 years of age.  You are at high risk for heart disease. What should I know about cancer screening? Depending on your health history and family history, you  may need to have cancer screening at various ages. This may include screening for:  Breast cancer.  Cervical cancer.  Colorectal cancer.  Skin cancer.  Lung cancer. What should I know about heart disease, diabetes, and high blood pressure? Blood pressure and heart disease  High blood pressure causes heart disease and increases the risk of stroke. This is more likely to develop in people who have high blood pressure readings, are of African descent, or are overweight.  Have your blood pressure checked: ? Every 3-5 years if you are 64-56 years of age. ? Every year if you are 61 years old or older. Diabetes Have regular diabetes screenings. This checks your fasting blood sugar level. Have the screening done:  Once every three years after age 8 if you are at a normal weight and have a low risk for diabetes.  More often and at a younger age if you are overweight or have a high risk for diabetes. What should I know about preventing infection? Hepatitis B If you have a higher risk for hepatitis B, you should be screened for this virus. Talk with your health care provider to find out if you are at risk for hepatitis B infection. Hepatitis C Testing is recommended for:  Everyone born from [birth year] through 1966.  Anyone with known risk factors for hepatitis C. Sexually transmitted infections (STIs)  Get screened for STIs, including gonorrhea and chlamydia, if: ? You are sexually active and are younger than 25 years of age. ? You are older than  25 years of age and your health care provider tells you that you are at risk for this type of infection. ? Your sexual activity has changed since you were last screened, and you are at increased risk for chlamydia or gonorrhea. Ask your health care provider if you are at risk.  Ask your health care provider about whether you are at high risk for HIV. Your health care provider may recommend a prescription medicine to help prevent HIV infection. If  you choose to take medicine to prevent HIV, you should first get tested for HIV. You should then be tested every 3 months for as long as you are taking the medicine. Pregnancy  If you are about to stop having your period (premenopausal) and you may become pregnant, seek counseling before you get pregnant.  Take 400 to 800 micrograms (mcg) of folic acid every day if you become pregnant.  Ask for birth control (contraception) if you want to prevent pregnancy. Osteoporosis and menopause Osteoporosis is a disease in which the bones lose minerals and strength with aging. This can result in bone fractures. If you are 70 years old or older, or if you are at risk for osteoporosis and fractures, ask your health care provider if you should:  Be screened for bone loss.  Take a calcium or vitamin D supplement to lower your risk of fractures.  Be given hormone replacement therapy (HRT) to treat symptoms of menopause. Follow these instructions at home: Lifestyle  Do not use any products that contain nicotine or tobacco, such as cigarettes, e-cigarettes, and chewing tobacco. If you need help quitting, ask your health care provider.  Do not use street drugs.  Do not share needles.  Ask your health care provider for help if you need support or information about quitting drugs. Alcohol use  Do not drink alcohol if: ? Your health care provider tells you not to drink. ? You are pregnant, may be pregnant, or are planning to become pregnant.  If you drink alcohol: ? Limit how much you use to 0-1 drink a day. ? Limit intake if you are breastfeeding.  Be aware of how much alcohol is in your drink. In the [place] one drink equals one 12 oz bottle of beer (355 mL), one 5 oz glass of wine (148 mL), or one 1 oz glass of hard liquor (44 mL). General instructions  Schedule regular health, dental, and eye exams.  Stay current with your vaccines.  Tell your health care provider if: ? You often feel  depressed. ? You have ever been abused or do not feel safe at home. Summary  Adopting a healthy lifestyle and getting preventive care are important in promoting health and wellness.  Follow your health care provider's instructions about healthy diet, exercising, and getting tested or screened for diseases.  Follow your health care provider's instructions on monitoring your cholesterol and blood pressure. This information is not intended to replace advice given to you by your health care provider. Make sure you discuss any questions you have with your health care provider. Document Revised: 05/12/2018 Document Reviewed: 05/12/2018 Elsevier Patient Education  2020 [employer].

## 2020-06-01 LAB — URINE CULTURE

## 2020-06-04 ENCOUNTER — Other Ambulatory Visit: Payer: BC Managed Care – PPO

## 2020-06-04 DIAGNOSIS — Z20822 Contact with and (suspected) exposure to covid-19: Secondary | ICD-10-CM

## 2020-06-05 LAB — NOVEL CORONAVIRUS, NAA: SARS-CoV-2, NAA: NOT DETECTED

## 2020-06-05 LAB — CYTOLOGY - PAP
Diagnosis: NEGATIVE
Diagnosis: REACTIVE

## 2020-06-05 LAB — SARS-COV-2, NAA 2 DAY TAT

## 2020-11-10 ENCOUNTER — Telehealth: Payer: BC Managed Care – PPO | Admitting: Nurse Practitioner

## 2020-11-10 ENCOUNTER — Encounter: Payer: Self-pay | Admitting: Nurse Practitioner

## 2020-11-10 DIAGNOSIS — F410 Panic disorder [episodic paroxysmal anxiety] without agoraphobia: Secondary | ICD-10-CM

## 2020-11-10 DIAGNOSIS — U071 COVID-19: Secondary | ICD-10-CM | POA: Diagnosis not present

## 2020-11-10 MED ORDER — BENZONATATE 100 MG PO CAPS: 100.0000 mg | ORAL_CAPSULE | Freq: Three times a day (TID) | ORAL | 0 refills | Status: DC | PRN

## 2020-11-10 MED ORDER — ALBUTEROL SULFATE HFA 108 (90 BASE) MCG/ACT IN AERS: 2.0000 | INHALATION_SPRAY | Freq: Four times a day (QID) | RESPIRATORY_TRACT | 0 refills | Status: DC | PRN

## 2020-11-10 NOTE — Progress Notes (Signed)
Deanna Lopez, Deanna Lopez are scheduled for a virtual visit with Mary-Margaret Kalyna Paolella< FNP today.    Just as we do with appointments in the office, we must obtain your consent to participate.  Your consent will be active for this visit and any virtual visit you may have with one of our providers in the next 365 days.    If you have a MyChart account, I can also send a copy of this consent to you electronically.  All virtual visits are billed to your insurance company just like a traditional visit in the office.  As this is a virtual visit, video technology does not allow for your provider to perform a traditional examination.  This may limit your provider's ability to fully assess your condition.  If your provider identifies any concerns that need to be evaluated in person or the need to arrange testing such as labs, EKG, etc, we will make arrangements to do so.    Although advances in technology are sophisticated, we cannot ensure that it will always work on either your end or our end.  If the connection with a video visit is poor, we may have to switch to a telephone visit.  With either a video or telephone visit, we are not always able to ensure that we have a secure connection.   I need to obtain your verbal consent now.   Are you willing to proceed with your visit today?   Deanna Lopez has provided verbal consent on 11/10/2020 for a virtual visit (video or telephone).  Virtual Visit via Video  I connected with  Deanna Lopez  on 11/10/20 at 7:00 by video and verified that I am speaking with the correct person using two identifiers. Deanna Lopez is currently located at home and no one is currently with  her during visit. The provider, Mary-Margaret Daphine Deutscher, FNP is located at home at time of visit.  I discussed the limitations, risks, security and privacy concerns of performing an evaluation and management service by video  and the availability of in person appointments. I also discussed with the  patient that there may be a patient responsible charge related to this service. The patient expressed understanding and agreed to proceed.   Subjective:   HPI:   Patient started having coivd symptoms on tuesday of last week. She was having cough, congestion and n=body aches. She did at home test Tuesday and as negative. Repated covid test on wednseday and was positive. She is all better except for slight cough. She says she has had several episodes today where she felt like she could not catch her breath and hands went numb.  Review of Systems  Constitutional:  Negative for chills and fever.  HENT:  Negative for congestion (resolves).   Respiratory:  Positive for cough. Negative for sputum production and shortness of breath.   Musculoskeletal:  Negative for myalgias.  Neurological:  Negative for headaches.    See pertinent positives and negatives per HPI.  There are no problems to display for this patient.   Social History   Tobacco Use   Smoking status: Never   Smokeless tobacco: Never  Substance Use Topics   Alcohol use: Yes    Alcohol/week: 3.0 standard drinks    Types: 3 Standard drinks or equivalent per week    Current Outpatient Medications:    fexofenadine (ALLEGRA) 180 MG tablet, Take 1 tablet by mouth daily., Disp: , Rfl:    nitrofurantoin (MACRODANTIN) 50 MG capsule, Take one tablet po prn  intercourse., Disp: 30 capsule, Rfl: 2  No Active Allergies  Objective:   There were no vitals taken for this visit.  Patient is well-developed, well-nourished in no acute distress.  Resting comfortably  at home.  Head is normocephalic, atraumatic.  No labored breathing.  Speech is clear and coherent with logical content.  Patient is alert and oriented at baseline.  No cough noted during visit  Assessment and Plan:        Deanna Lopez in today with chief complaint of No chief complaint on file.   1. Lab test positive for detection of COVID-19 virus Force  fluids Rest Meds ordered this encounter  Medications   benzonatate (TESSALON PERLES) 100 MG capsule    Sig: Take 1 capsule (100 mg total) by mouth 3 (three) times daily as needed.    Dispense:  20 capsule    Refill:  0    Order Specific Question:   Supervising Provider    Answer:   MILLER, BRIAN [[id number]]   albuterol (VENTOLIN HFA) 108 (90 Base) MCG/ACT inhaler    Sig: Inhale 2 puffs into the lungs every 6 (six) hours as needed for wheezing or shortness of breath.    Dispense:  8 g    Refill:  0    Order Specific Question:   Supervising Provider    Answer:   MILLER, BRIAN [[id number]]     2. Panic attack Stress management Deep breathing when occurs See PCP if does not improve.    The above assessment and management plan was discussed with the patient. The patient verbalized understanding of and has agreed to the management plan. Patient is aware to call the clinic if symptoms persist or worsen. Patient is aware when to return to the clinic for a follow-up visit. Patient educated on when it is appropriate to go to the emergency department.   Mary-Margaret Daphine Deutscher, FNP  .   Mary-Margaret Daphine Deutscher, FNP 11/10/2020  Time spent with the patient: 15 minutes, of which >50% was spent in obtaining information about symptoms, reviewing previous labs, evaluations, and treatments, counseling about condition (please see the discussed topics above), and developing a plan to further investigate it; had a number of questions which I addressed.

## 2021-03-02 IMAGING — US US BREAST*L* LIMITED INC AXILLA
1 series · 3 of 3 positions shown · non-contrast
Comparison: Previous exam(s).
COMPARISON: Previous exam(s).

Addendum:
CLINICAL DATA: Palpable lump in the left breast felt by the
patient's physician.

EXAM:
ULTRASOUND OF THE LEFT BREAST

[Series 1: us breast*left* limited inc axilla · 0.05mm/px · 3 of 3 slices shown]
[im 1/3]
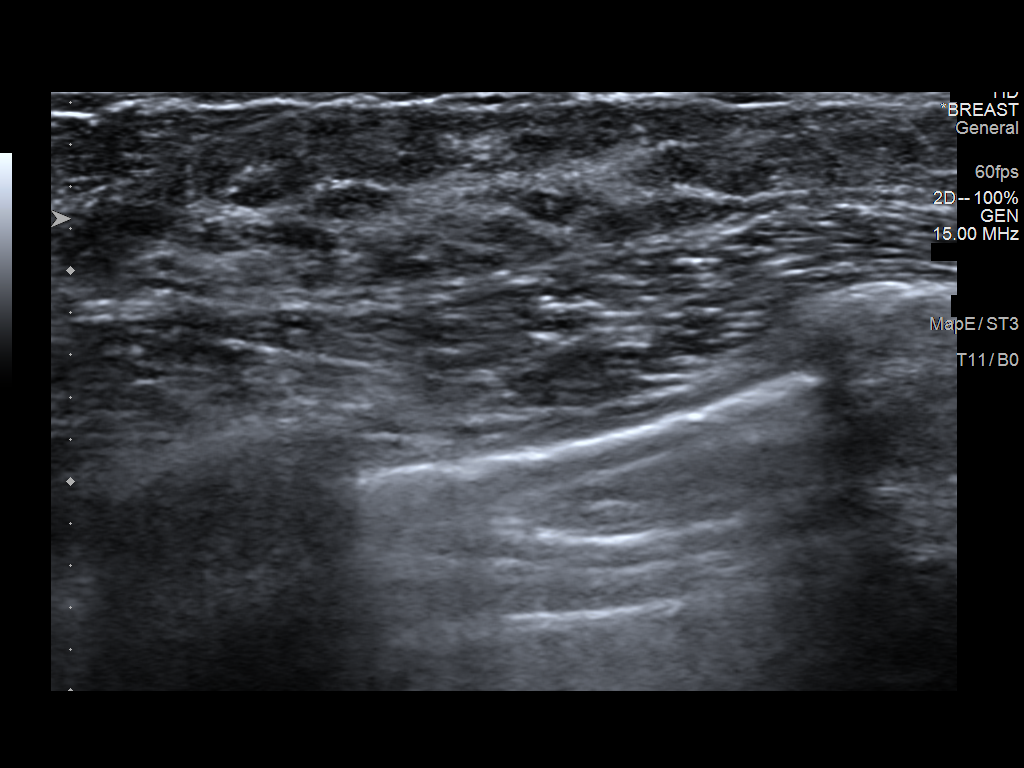
[im 2/3]
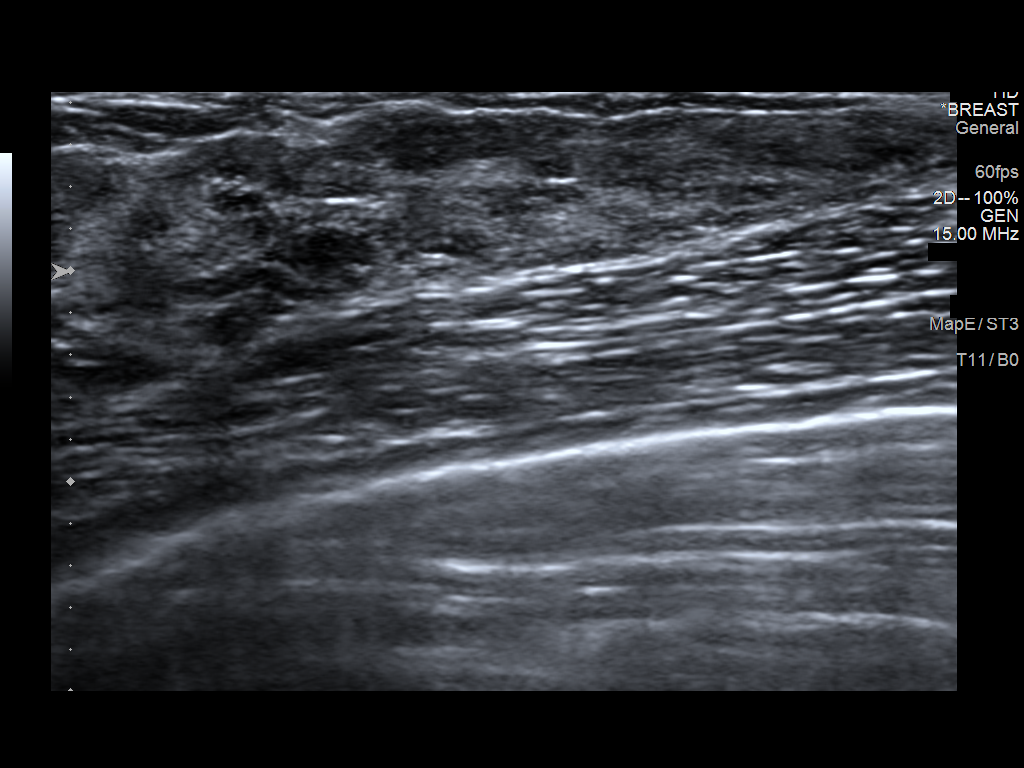
[im 3/3]
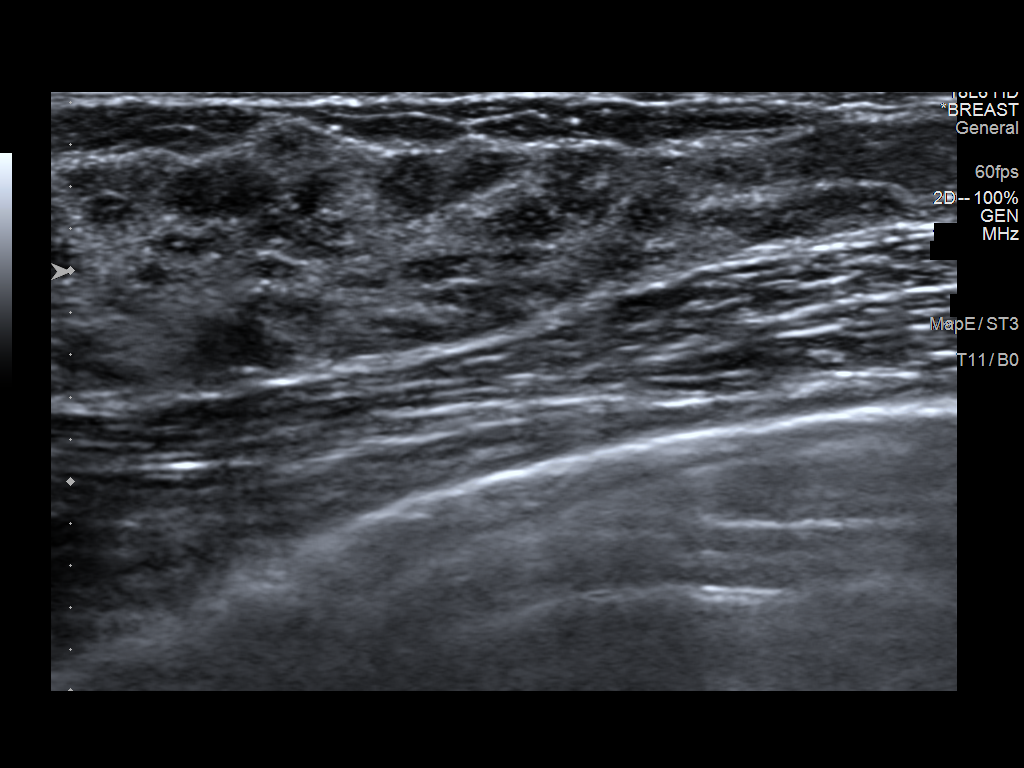

[3 of 3 positions shown; findings below may reference images not displayed]

FINDINGS: On physical exam, no suspicious lumps are identified.

Targeted ultrasound is performed, showing normal tissue. No mass
identified.
IMPRESSION: No sonographic evidence malignancy

RECOMMENDATION:
Treatment of lump should be based on clinical and physical exam
given lack of imaging findings. Recommend annual screening
mammography beginning.

I have discussed the findings and recommendations with the patient.
If applicable, a reminder letter will be sent to the patient
regarding the next appointment.

BI-RADS CATEGORY  1: Negative.

ADDENDUM:
Recommend annual screening mammography beginning at the age of 40.

*** End of Addendum ***
FINDINGS: On physical exam, no suspicious lumps are identified.

Targeted ultrasound is performed, showing normal tissue. No mass
identified.
IMPRESSION: No sonographic evidence malignancy

RECOMMENDATION:
Treatment of lump should be based on clinical and physical exam
given lack of imaging findings. Recommend annual screening
mammography beginning.

I have discussed the findings and recommendations with the patient.
If applicable, a reminder letter will be sent to the patient
regarding the next appointment.

BI-RADS CATEGORY  1: Negative.

## 2021-06-12 NOTE — Progress Notes (Signed)
26 y.o. G0P0000 Single White or Caucasian Not Hispanic or Latino female here for annual exam.  She has a paragard IUD, inserted in 5/19.  Sexually active, same partner x 2 years (don't live together).  Period Cycle (Days): 30 Period Duration (Days): 4 Period Pattern: Regular Menstrual Flow: Heavy Menstrual Control: Tampon, Panty liner Menstrual Control Change Freq (Hours): 2 Dysmenorrhea: (!) Mild Dysmenorrhea Symptoms: Cramping  Takes antibiotics with intercourse.   She has occasional deep dyspareunia, only 1/15 times which has improved. If it hurts, the pain will last for 30-60 minutes, up to a 7/10 in severity. It feels like a bladder pain, like a UTI.   Patient's last menstrual period was 05/27/2021.          Sexually active: Yes.    The current method of family planning is IUD.    Exercising: Yes.     Yoga and strength training  Smoker:  no  Health Maintenance: Pap: 05/23/20 WNL 05-17-2019 LGSIL History of abnormal Pap:  yes MMG:  U/S left breast 06/06/19 bi-rads 1 neg  BMD:   none  Colonoscopy: none  TDaP:  05/17/19  Gardasil: complete    reports that she has never smoked. She has never used smokeless tobacco. She reports current alcohol use of about 3.0 standard drinks per week. She reports that she does not use drugs. 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  History reviewed. No pertinent past medical history.  History reviewed. No pertinent surgical history.  Current Outpatient Medications  Medication Sig Dispense Refill   fexofenadine (ALLEGRA) 180 MG tablet Take 1 tablet by mouth daily.     nitrofurantoin (MACRODANTIN) 50 MG capsule Take one tablet po prn intercourse. 30 capsule 2   paragard intrauterine copper IUD IUD 1 each by Intrauterine route once.     No current facility-administered medications for this visit.    Family History  Problem Relation Age of Onset   Breast cancer Maternal Grandmother    Breast cancer  Paternal Grandmother     Review of Systems  All other systems reviewed and are negative.  Exam:   BP 124/68    Pulse 72    Ht 5\' 5"  (1.651 m)    Wt 113 lb (51.3 kg)    LMP 05/27/2021    SpO2 99%    BMI 18.80 kg/m   Weight change: @WEIGHTCHANGE @ Height:   Height: 5\' 5"  (165.1 cm)  Ht Readings from Last 3 Encounters:  06/17/21 5\' 5"  (1.651 m)  05/23/20 5' 6.25" (1.683 m)  11/22/19 5\' 5"  (1.651 m)    General appearance: alert, cooperative and appears stated age Head: Normocephalic, without obvious abnormality, atraumatic Neck: no adenopathy, supple, symmetrical, trachea midline and thyroid normal to inspection and palpation Lungs: clear to auscultation bilaterally Cardiovascular: regular rate and rhythm Breasts: normal appearance, no masses or tenderness Abdomen: soft, non-tender; non distended,  no masses,  no organomegaly Extremities: extremities normal, atraumatic, no cyanosis or edema Skin: Skin color, texture, turgor normal. No rashes or lesions Lymph nodes: Cervical, supraclavicular, and axillary nodes normal. No abnormal inguinal nodes palpated Neurologic: Grossly normal   Pelvic: External genitalia:  no lesions              Urethra:  normal appearing urethra with no masses, tenderness or lesions              Bartholins and Skenes: normal  Vagina: normal appearing vagina with normal color and discharge, no lesions              Cervix: no lesions and IUD strings 3 cm               Bimanual Exam:  Uterus:  normal size, contour, position, consistency, mobility, non-tender and retroverted              Adnexa: no mass, fullness, tenderness               Rectovaginal: Confirms               Anus:  normal sphincter tone, no lesions  Carolynn Serve chaperoned for the exam.  1. Well woman exam Discussed breast self exam Discussed calcium and vit D intake No labs this year  2. Screening for cervical cancer - Cytology - PAP  3. Recurrent UTI - nitrofurantoin  (MACRODANTIN) 50 MG capsule; Take one tablet po prn intercourse.  Dispense: 30 capsule; Refill: 2  4. History of cervical dysplasia - Cytology - PAP  5. IUD check up Doing well

## 2021-06-17 ENCOUNTER — Other Ambulatory Visit: Payer: Self-pay

## 2021-06-17 ENCOUNTER — Other Ambulatory Visit (HOSPITAL_COMMUNITY)
Admission: RE | Admit: 2021-06-17 | Discharge: 2021-06-17 | Disposition: A | Discharge status: Home / Self Care | Payer: BC Managed Care – PPO | Source: Ambulatory Visit | Attending: Obstetrics and Gynecology | Admitting: Obstetrics and Gynecology

## 2021-06-17 ENCOUNTER — Encounter: Payer: Self-pay | Admitting: Obstetrics and Gynecology

## 2021-06-17 ENCOUNTER — Ambulatory Visit (INDEPENDENT_AMBULATORY_CARE_PROVIDER_SITE_OTHER): Payer: BC Managed Care – PPO | Admitting: Obstetrics and Gynecology

## 2021-06-17 VITALS — BP 124/68 | HR 72 | Ht 65.0 in | Wt 113.0 lb

## 2021-06-17 DIAGNOSIS — Z124 Encounter for screening for malignant neoplasm of cervix: Secondary | ICD-10-CM | POA: Diagnosis present

## 2021-06-17 DIAGNOSIS — Z01419 Encounter for gynecological examination (general) (routine) without abnormal findings: Secondary | ICD-10-CM

## 2021-06-17 DIAGNOSIS — N390 Urinary tract infection, site not specified: Secondary | ICD-10-CM | POA: Diagnosis not present

## 2021-06-17 DIAGNOSIS — Z30431 Encounter for routine checking of intrauterine contraceptive device: Secondary | ICD-10-CM

## 2021-06-17 DIAGNOSIS — Z87410 Personal history of cervical dysplasia: Secondary | ICD-10-CM | POA: Insufficient documentation

## 2021-06-17 MED ORDER — NITROFURANTOIN MACROCRYSTAL 50 MG PO CAPS: ORAL_CAPSULE | ORAL | 2 refills | Status: DC

## 2021-06-17 NOTE — Patient Instructions (Signed)

## 2021-06-19 LAB — CYTOLOGY - PAP
Comment: NEGATIVE
Diagnosis: UNDETERMINED — AB
High risk HPV: NEGATIVE

## 2022-07-01 NOTE — Progress Notes (Signed)
27 y.o. G0P0000 Single White or Caucasian Not Hispanic or Latino female here for annual exam.  She has a paragard IUD, inserted in 5/19.  Period Cycle (Days): 28 Period Duration (Days): 3 Period Pattern: Regular Menstrual Flow: Heavy Menstrual Control: Tampon, Maxi pad Dysmenorrhea: (!) Moderate Dysmenorrhea Symptoms: Cramping She can saturate an ultra tampon in up to 2 hours for 1/2 of a day, then it slows down. Sexually active, no current partner.  She takes prophylactic macrodantin with intercourse secondary to a h/o recurrent UTI's.   LMP:  06/22/2022    Sexually active: Yes.    The current method of family planning is paragard IUD.    Exercising: Yes.    Yoga, weight lifting Smoker:  no  Health Maintenance: Pap: 06/17/21 ASCUS Hr HPV neg; 05/23/20 WNL; 05-17-2019 LGSIL History of abnormal Pap:  yes L Breast US:  bi-rads 1 neg  BMD:   n/a Colonoscopy: n/a TDaP:  05/17/19 Gardasil: complete    reports that she has never smoked. She has never been exposed to tobacco smoke. She has never used smokeless tobacco. She reports current alcohol use of about 3.0 standard drinks of alcohol per week. She reports that she does not use drugs. 6-7 drinks a week. 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    History reviewed. No pertinent past medical history.  History reviewed. No pertinent surgical history.  Current Outpatient Medications  Medication Sig Dispense Refill   fexofenadine (ALLEGRA) 180 MG tablet Take 1 tablet by mouth daily.     nitrofurantoin (MACRODANTIN) 50 MG capsule Take one tablet po prn intercourse. 30 capsule 2   paragard intrauterine copper IUD IUD 1 each by Intrauterine route once.     No current facility-administered medications for this visit.    Family History  Problem Relation Age of Onset   Breast cancer Maternal Grandmother    Breast cancer Paternal Grandmother     Review of Systems  All other systems reviewed and are negative.   Exam:   BP  (!) 98/58 (BP Location: Left Arm, Cuff Size: Normal)   Ht 5\' 5"  (1.651 m)   Wt 113 lb (51.3 kg)   LMP 06/22/2022 (Exact Date)   BMI 18.80 kg/m   Weight change: @WEIGHTCHANGE @ Height:   Height: 5\' 5"  (165.1 cm)  Ht Readings from Last 3 Encounters:  07/10/22 5\' 5"  (1.651 m)  06/17/21 5\' 5"  (1.651 m)  05/23/20 5' 6.25" (1.683 m)    General appearance: alert, cooperative and appears stated age Head: Normocephalic, without obvious abnormality, atraumatic Neck: no adenopathy, supple, symmetrical, trachea midline and thyroid normal to inspection and palpation Lungs: clear to auscultation bilaterally Cardiovascular: regular rate and rhythm Breasts: normal appearance, no masses or tenderness Abdomen: soft, non-tender; non distended,  no masses,  no organomegaly Extremities: extremities normal, atraumatic, no cyanosis or edema Skin: Skin color, texture, turgor normal. No rashes or lesions Lymph nodes: Cervical, supraclavicular, and axillary nodes normal. No abnormal inguinal nodes palpated Neurologic: Grossly normal   Pelvic: External genitalia:  no lesions              Urethra:  normal appearing urethra with no masses, tenderness or lesions              Bartholins and Skenes: normal                 Vagina: normal appearing vagina with normal color and discharge, no lesions  Cervix: no lesions and IUD strings 2-3 cm               Bimanual Exam:  Uterus:  normal size, contour, position, consistency, mobility, non-tender              Adnexa: no mass, fullness, tenderness               Rectovaginal: Confirms               Anus:  normal sphincter tone, no lesions  Santiago Glad, CMA chaperoned for the exam.  1. Well woman exam Discussed breast self exam Discussed calcium and vit D intake No screening labs this year  2. Screening for cervical cancer - Cytology - PAP  3. Recurrent UTI Controlled with medication - nitrofurantoin (MACRODANTIN) 50 MG capsule; Take one tablet po prn  intercourse.  Dispense: 30 capsule; Refill: 2  4. IUD check up Doing well, cycles are heavy, but tolerable  5. Screening examination for STD (sexually transmitted disease) - Cytology - PAP - RPR - HIV Antibody (routine testing w rflx) - Hepatitis C antibody - HSV(herpes simplex vrs) 1+2 ab-IgG  6. Menorrhagia with regular cycle Cycles are heavy for 1/2 a day with the paragard IUD. Tolerable - CBC to r/o anemia.

## 2022-07-10 ENCOUNTER — Ambulatory Visit (INDEPENDENT_AMBULATORY_CARE_PROVIDER_SITE_OTHER): Payer: BC Managed Care – PPO | Admitting: Obstetrics and Gynecology

## 2022-07-10 ENCOUNTER — Encounter: Payer: Self-pay | Admitting: Obstetrics and Gynecology

## 2022-07-10 ENCOUNTER — Other Ambulatory Visit (HOSPITAL_COMMUNITY)
Admission: RE | Admit: 2022-07-10 | Discharge: 2022-07-10 | Disposition: A | Discharge status: Home / Self Care | Payer: BC Managed Care – PPO | Source: Ambulatory Visit | Attending: Obstetrics and Gynecology | Admitting: Obstetrics and Gynecology

## 2022-07-10 VITALS — BP 98/58 | Ht 65.0 in | Wt 113.0 lb

## 2022-07-10 DIAGNOSIS — N390 Urinary tract infection, site not specified: Secondary | ICD-10-CM

## 2022-07-10 DIAGNOSIS — Z124 Encounter for screening for malignant neoplasm of cervix: Secondary | ICD-10-CM | POA: Insufficient documentation

## 2022-07-10 DIAGNOSIS — N920 Excessive and frequent menstruation with regular cycle: Secondary | ICD-10-CM

## 2022-07-10 DIAGNOSIS — Z01419 Encounter for gynecological examination (general) (routine) without abnormal findings: Secondary | ICD-10-CM | POA: Diagnosis not present

## 2022-07-10 DIAGNOSIS — Z113 Encounter for screening for infections with a predominantly sexual mode of transmission: Secondary | ICD-10-CM

## 2022-07-10 DIAGNOSIS — Z30431 Encounter for routine checking of intrauterine contraceptive device: Secondary | ICD-10-CM

## 2022-07-10 MED ORDER — NITROFURANTOIN MACROCRYSTAL 50 MG PO CAPS: ORAL_CAPSULE | ORAL | 2 refills | Status: DC

## 2022-07-10 NOTE — Patient Instructions (Signed)

## 2022-07-15 LAB — CBC
HCT: 37.9 % (ref 35.0–45.0)
Hemoglobin: 12.8 g/dL (ref 11.7–15.5)
MCH: 31.9 pg (ref 27.0–33.0)
MCHC: 33.8 g/dL (ref 32.0–36.0)
MCV: 94.5 fL (ref 80.0–100.0)
MPV: 10.3 fL (ref 7.5–12.5)
Platelets: 250 10*3/uL (ref 140–400)
RBC: 4.01 10*6/uL (ref 3.80–5.10)
RDW: 11.5 % (ref 11.0–15.0)
WBC: 5.9 10*3/uL (ref 3.8–10.8)

## 2022-07-15 LAB — HIV ANTIBODY (ROUTINE TESTING W REFLEX): HIV 1&2 Ab, 4th Generation: NONREACTIVE

## 2022-07-15 LAB — HSV(HERPES SIMPLEX VRS) I + II AB-IGG
HAV 1 IGG,TYPE SPECIFIC AB: 1 index — ABNORMAL HIGH
HSV 2 IGG,TYPE SPECIFIC AB: 6.35 index — ABNORMAL HIGH

## 2022-07-15 LAB — RPR: RPR Ser Ql: NONREACTIVE

## 2022-07-15 LAB — HCV RNA,QUANTITATIVE REAL TIME PCR
HCV Quantitative Log: <1.18 Log IU/mL
HCV RNA, PCR, QN: <15 IU/mL

## 2022-07-15 LAB — HEPATITIS C ANTIBODY: Hepatitis C Ab: REACTIVE — AB

## 2022-07-17 LAB — CYTOLOGY - PAP
Chlamydia: NEGATIVE
Comment: NEGATIVE
Comment: NEGATIVE
Comment: NEGATIVE
Comment: NORMAL
Diagnosis: NEGATIVE
High risk HPV: NEGATIVE
Neisseria Gonorrhea: NEGATIVE
Trichomonas: NEGATIVE

## 2022-07-22 ENCOUNTER — Ambulatory Visit: Payer: BC Managed Care – PPO | Admitting: Obstetrics and Gynecology

## 2022-07-22 ENCOUNTER — Encounter: Payer: Self-pay | Admitting: Obstetrics and Gynecology

## 2022-07-22 VITALS — BP 120/82 | HR 68 | Ht 65.0 in | Wt 114.0 lb

## 2022-07-22 DIAGNOSIS — A6000 Herpesviral infection of urogenital system, unspecified: Secondary | ICD-10-CM

## 2022-07-22 MED ORDER — VALACYCLOVIR HCL 500 MG PO TABS: ORAL_TABLET | ORAL | 1 refills | Status: DC

## 2022-07-22 NOTE — Patient Instructions (Signed)
Genital Herpes Genital herpes is a common sexually transmitted infection (STI) that is caused by a virus. The virus spreads from person to person through contact with a sore, infected saliva, or infected skin. The virus can cause itching, blisters, and sores around the genitals or rectum. During an outbreak of infection, symptoms may last for several days and then go away. However, the virus remains in the body, so more outbreaks may happen in the future. The time between outbreaks varies and can be from months to years. Genital herpes can affect anyone. It is particularly concerning for pregnant women because the virus can be passed to the baby during delivery. Genital herpes is also a concern for people who have a weak disease-fighting system (immune system). What are the causes? This condition is caused by the herpes simplex virus, type 1 or type 2 (HSV-1 or HSV-2). The virus may spread through: Sexual contact with an infected person, including vaginal, anal, and oral sex. Contact with a herpes sore. The skin. This means that you can get herpes from an infected partner even if there are no blisters or sores present. Your partner may not know that he or she is infected. What increases the risk? You are more likely to develop this condition if: You have sex with many partners. You do not use latex or polyurethane condoms during sex. What are the signs or symptoms? Most people do not have symptoms or they have mild symptoms that may be mistaken for other skin problems. Symptoms may include: Small, red bumps near the genitals, rectum, or mouth. These bumps turn into blisters and then sores. Flu-like (influenza-like) symptoms, including: Fever. Body aches. Swollen lymph nodes. Headache. Painful urination. Pain and itching in the genital area or rectal area. Vaginal discharge. Tingling or shooting pain in the legs and buttocks. Generally, symptoms are more severe and last longer during the first  (primary) outbreak. Influenza-like symptoms are also more common during the primary outbreak. How is this diagnosed? This condition may be diagnosed based on: A physical exam. Your medical history. Blood tests. A test of a fluid sample (culture) from an open sore. How is this treated? There is no cure for this condition, but treatment with antiviral medicines can do the following: Speed up healing and relieve symptoms. Help to reduce the spread of the virus to sexual partners. Limit the chance of future outbreaks, or make future outbreaks shorter. Lessen symptoms of future outbreaks. Your health care provider may also recommend over-the-counter medicines to help with pain and itching. Follow these instructions at home: If you have an outbreak:  Keep the affected areas dry and clean. Avoid rubbing or touching blisters and sores. If you do touch blisters or sores: Wash your hands thoroughly with soap and water for at least 20 seconds. If soap and water are not available, use an alcohol-based hand sanitizer. Do not touch your eyes afterward. Sexual activity Do not have sexual contact during active outbreaks. Practice safe sex. Herpes can spread even if your partner does not have blisters or sores. Latex or polyurethane condoms and female condoms may help prevent the spread of the herpes virus. Managing pain and discomfort If directed, put ice on the painful area. To do this: Put ice in a plastic bag. Place a towel between your skin and the bag. Leave the ice on for 20 minutes, 2-3 times a day. Remove the ice if your skin turns bright red. This is very important. If you cannot feel pain, heat, or  cold, you have a greater risk of damage to the area. If told, take a cool sitz bath to help relieve pain or itching. A sitz bath is a water bath that you take while sitting down in water that is deep enough to cover your hips and buttocks. General instructions Take over-the-counter and  prescription medicines only as told by your health care provider. If you were prescribed an antiviral medicine, use it as told by your health care provider. Do not stop using the antiviral even if you start to feel better. Keep all follow-up visits. This is important. How is this prevented? Use condoms. Although you can get genital herpes during sexual contact even with the use of a condom, a condom can provide some protection. Avoid having multiple sexual partners. Talk with your sexual partner about any symptoms either of you may have. Also, talk with your partner about any history of STIs. Do not have sexual contact if you have active symptoms of genital herpes. Contact a health care provider if: Your symptoms are not improving with medicine. Your symptoms return, or you have new symptoms. You have a fever. You have abdominal pain. You have redness, swelling, or pain in your eye. You notice new sores on other parts of your body. You have had herpes and you become pregnant or plan to become pregnant. Get help right away if: You have symptoms of viral meningitis. This is rare but may happen if the virus spreads to the brain. Symptoms may include: Severe headache or stiff neck. Muscle aches. Nausea and vomiting. Sensitivity to light. Summary Genital herpes is a common sexually transmitted infection (STI) that is caused by the herpes simplex virus, type 1 or type 2 (HSV-1 or HSV-2). These viruses are most often spread through sexual contact with an infected person. You are more likely to develop this condition if you have sex with many partners or you do not use condoms during sex. Most people do not have symptoms or have mild symptoms that may be mistaken for other skin problems. Symptoms occur as outbreaks that may happen months or years apart. There is no cure for this condition, but treatment with oral antiviral medicines can reduce symptoms, reduce the chance of spreading the virus to  a partner, prevent future outbreaks, or shorten future outbreaks. This information is not intended to replace advice given to you by your health care provider. Make sure you discuss any questions you have with your health care provider. Document Revised: 02/21/2021 Document Reviewed: 02/21/2021 Elsevier Patient Education  [employer].

## 2022-07-22 NOTE — Progress Notes (Signed)
GYNECOLOGY  VISIT   HPI: 27 y.o.   Single White or Caucasian Not Hispanic or Latino  female   G0P0000 with Patient's last menstrual period was 06/22/2022 (exact date).   here for  abnormal HSV and Hep C results.  She had a reactive HCV antibody, no HCV RNA detected. C/W old infection or a false +.  Her HSV serology was + and the HSV 1 was equivocal. No outbreaks, no cold sores.   Sexually active, no current partner.    GYNECOLOGIC HISTORY: Patient's last menstrual period was 06/22/2022 (exact date). Contraception:IUD  Menopausal hormone therapy: none         OB History     Gravida  0   Para  0   Term  0   Preterm  0   AB  0   Living  0      SAB  0   IAB  0   Ectopic  0   Multiple  0   Live Births  0              There are no problems to display for this patient.   History reviewed. No pertinent past medical history.  History reviewed. No pertinent surgical history.  Current Outpatient Medications  Medication Sig Dispense Refill   fexofenadine (ALLEGRA) 180 MG tablet Take 1 tablet by mouth daily.     nitrofurantoin (MACRODANTIN) 50 MG capsule Take one tablet po prn intercourse. 30 capsule 2   paragard intrauterine copper IUD IUD 1 each by Intrauterine route once.     No current facility-administered medications for this visit.     ALLERGIES: Patient has no known allergies.  Family History  Problem Relation Age of Onset   Breast cancer Maternal Grandmother    Breast cancer Paternal Grandmother     Social History   Socioeconomic History   Marital status: Single    Spouse name: Not on file   Number of children: Not on file   Years of education: Not on file   Highest education level: Not on file  Occupational History   Not on file  Tobacco Use   Smoking status: Never    Passive exposure: Never   Smokeless tobacco: Never  Vaping Use   Vaping Use: Never used  Substance and Sexual Activity   Alcohol use: Yes    Alcohol/week: 3.0  standard drinks of alcohol    Types: 3 Standard drinks or equivalent per week    Comment: socially   Drug use: Never   Sexual activity: Yes    Partners: Male, Female    Birth control/protection: I.U.D.    Comment: Paragard IUD, inserted 09/2017  Other Topics Concern   Not on file  Social History Narrative   Not on file   Social Determinants of Health   Financial Resource Strain: Not on file  Food Insecurity: Not on file  Transportation Needs: Not on file  Physical Activity: Not on file  Stress: Not on file  Social Connections: Not on file  Intimate Partner Violence: Not on file    Review of Systems  All other systems reviewed and are negative.   PHYSICAL EXAMINATION:    BP 120/82   Pulse 68   Ht 5' 5"$  (1.651 m)   Wt 114 lb (51.7 kg)   LMP 06/22/2022 (Exact Date)   BMI 18.97 kg/m     General appearance: alert, cooperative and appears stated age  82. Genital herpes simplex, unspecified site -She hasn't had  any outbreaks. -We discussed HSV, incidence, transmission -Condom use encouraged -We discussed the option of prophylactic use of Valtrex - valACYclovir (VALTREX) 500 MG tablet; Take one tablet po BID x 3 days as needed.  Dispense: 30 tablet; Refill: 1

## 2022-07-24 ENCOUNTER — Other Ambulatory Visit: Payer: Self-pay | Admitting: Obstetrics and Gynecology

## 2022-07-24 DIAGNOSIS — A6000 Herpesviral infection of urogenital system, unspecified: Secondary | ICD-10-CM

## 2022-07-24 NOTE — Telephone Encounter (Signed)
  Pharmacy comment: REQUEST FOR 90 DAYS PRESCRIPTION. DX Code Needed.    ICD 10 added.

## 2022-07-24 NOTE — Telephone Encounter (Signed)
This is not a daily medication, 180 tablet supply is not appropriate.

## 2022-08-09 ENCOUNTER — Other Ambulatory Visit: Payer: Self-pay | Admitting: Obstetrics and Gynecology

## 2022-08-09 DIAGNOSIS — A6000 Herpesviral infection of urogenital system, unspecified: Secondary | ICD-10-CM

## 2022-08-11 NOTE — Telephone Encounter (Signed)
Med refill request: Valacyclovir 500 mg tab, 1 tab bid for 3 days as needed  Hx HSV   Last AEX: 07/10/22 -JJ Next AEX: Not scheduled  Last MMG (if hormonal med) N/A   Rx sent on 07/24/22, #30/1RF   Spoke with patient. Filled initial Rx, does not need refill at this time. Advised patient original Rx was sent with one refill, contact pharmacy when needed. Patient appreciative of call.   Rx refused.   Routing to provider.   Encounter closed.

## 2023-09-24 ENCOUNTER — Encounter: Payer: Self-pay | Admitting: Obstetrics and Gynecology

## 2023-09-24 NOTE — Progress Notes (Signed)
 28 y.o. G0P0000 Single Caucasian non-Hispanic female here for annual exam. May need refills on macrobid  for post coital UTIs.  Cycles are usually 32 - 33 days apart.   Had a new partner since her last visit.  Desires STD testing.  Uses condoms.   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  No outbreaks.  Not using Valtrex .   From [place].  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 Traveling to [place] and the beach this summer.  PCP: Patient, No Pcp Per   Patient's last menstrual period was 09/28/2023 (exact date).     Period Cycle (Days):  (28-30) Period Duration (Days): 3-5 Menstrual Flow: Heavy Menstrual Control: Tampon Dysmenorrhea: (!) Mild Dysmenorrhea Symptoms: Cramping     Sexually active:  no  The current method of family planning is IUD.   Paragard - insertion 09/2017. Menopausal hormone therapy: n/a Exercising: Yes.     5x a week, strength and yoga.  Smoker: no  OB History  Gravida Para Term Preterm AB Living  0 0 0 0 0 0  SAB IAB Ectopic Multiple Live Births  0 0 0 0 0     HEALTH MAINTENANCE: Last 2 paps: 06/17/2021-ASCUS, HPV-neg, 07/10/2022-WNL, HPV- neg History of abnormal Pap or positive HPV: yes, 2020-LGSIL, 2023-ASCUS, HPV-neg  Mammogram: n/a Colonoscopy: n/a  Bone Density: n/a  Result n/a   She thinks she did Gardasil vaccine.   Immunization History  Administered Date(s) Administered   Influenza,inj,Quad PF,6+ Mos 03/15/2019   Influenza-Unspecified 03/15/2019   PFIZER(Purple Top)SARS-COV-2 Vaccination 08/17/2019, 09/14/2019   Tdap 05/17/2019      reports that she has never smoked. She has never been exposed to tobacco smoke. She has never used smokeless tobacco. She reports current alcohol use of about 3.0 standard drinks of alcohol per week. She reports that she does not use drugs.  Past Medical History:  Diagnosis Date   HSV infection    positive HSV I and II aby testing    History reviewed. No pertinent surgical history.  Current Outpatient  Medications  Medication Sig Dispense Refill   fexofenadine (ALLEGRA) 180 MG tablet Take 1 tablet by mouth daily.     paragard intrauterine copper IUD IUD 1 each by Intrauterine route once.     nitrofurantoin  (MACRODANTIN ) 50 MG capsule Take one tablet po prn intercourse. 30 capsule 1   valACYclovir  (VALTREX ) 500 MG tablet TAKE 1 TABLET BY MOUTH TWICE A DAY FOR 3 DAYS AS NEEDED (Patient not taking: Reported on 09/28/2023) 30 tablet 1   No current facility-administered medications for this visit.    ALLERGIES: Patient has no known allergies.  Family History  Problem Relation Age of Onset   Breast cancer Maternal Grandmother    Breast cancer Paternal Grandmother     Review of Systems  All other systems reviewed and are negative.   PHYSICAL EXAM:  BP 124/78   Pulse 66   Ht 5' 5.25" (1.657 m)   Wt 121 lb (54.9 kg)   LMP 09/28/2023 (Exact Date)   SpO2 98%   BMI 19.98 kg/m     General appearance: alert, cooperative and appears stated age Head: normocephalic, without obvious abnormality, atraumatic Neck: no adenopathy, supple, symmetrical, trachea midline and thyroid normal to inspection and palpation Lungs: clear to auscultation bilaterally Breasts: normal appearance, no masses or tenderness, No nipple retraction or dimpling, No nipple discharge or bleeding, No axillary adenopathy.  Nipples pierced.  Heart: regular rate and rhythm Abdomen: soft, non-tender; no masses, no organomegaly Extremities:  extremities normal, atraumatic, no cyanosis or edema Skin: skin color, texture, turgor normal. No rashes or lesions.  Tatoos present. Lymph nodes: cervical, supraclavicular, and axillary nodes normal. Neurologic: grossly normal  Pelvic: External genitalia:  no lesions              No abnormal inguinal nodes palpated.              Urethra:  normal appearing urethra with no masses, tenderness or lesions              Bartholins and Skenes: normal                 Vagina: normal appearing  vagina with normal color and discharge, no lesions              Cervix: no lesions.  Menstrual flow.  IUD strings noted.              Pap taken: No. Bimanual Exam:  Uterus:  normal size, contour, position, consistency, mobility, non-tender              Adnexa: no mass, fullness, tenderness    Chaperone was present for exam:  Alesia Husky, CMA  ASSESSMENT: Well woman visit with gynecologic exam. Paragard IUD.  STD screening.  PHQ-9: 0 Postcoital UTIs.  PLAN: Mammogram screening discussed. Self breast awareness reviewed. Pap and HRV collected:  no.  Due in 2027. Guidelines for Calcium, Vitamin D, regular exercise program including cardiovascular and weight bearing exercise. Medication refills:  Macrobid  50 mg po x 1 prn intercourse. She declines a refill of Valtrex .  Screening for HIV, syphilis, hep C, GC, CT, trichomonas. Follow up:  yearly and prn.

## 2023-09-28 ENCOUNTER — Ambulatory Visit (INDEPENDENT_AMBULATORY_CARE_PROVIDER_SITE_OTHER): Payer: Self-pay | Admitting: Obstetrics and Gynecology

## 2023-09-28 ENCOUNTER — Other Ambulatory Visit (HOSPITAL_COMMUNITY)
Admission: RE | Admit: 2023-09-28 | Discharge: 2023-09-28 | Disposition: A | Discharge status: Home / Self Care | Source: Ambulatory Visit | Attending: Obstetrics and Gynecology | Admitting: Obstetrics and Gynecology

## 2023-09-28 ENCOUNTER — Encounter: Payer: Self-pay | Admitting: Obstetrics and Gynecology

## 2023-09-28 VITALS — BP 124/78 | HR 66 | Ht 65.25 in | Wt 121.0 lb

## 2023-09-28 DIAGNOSIS — Z113 Encounter for screening for infections with a predominantly sexual mode of transmission: Secondary | ICD-10-CM | POA: Diagnosis not present

## 2023-09-28 DIAGNOSIS — N390 Urinary tract infection, site not specified: Secondary | ICD-10-CM | POA: Diagnosis not present

## 2023-09-28 DIAGNOSIS — Z114 Encounter for screening for human immunodeficiency virus [HIV]: Secondary | ICD-10-CM

## 2023-09-28 DIAGNOSIS — Z01419 Encounter for gynecological examination (general) (routine) without abnormal findings: Secondary | ICD-10-CM | POA: Diagnosis not present

## 2023-09-28 DIAGNOSIS — Z1159 Encounter for screening for other viral diseases: Secondary | ICD-10-CM

## 2023-09-28 DIAGNOSIS — Z1331 Encounter for screening for depression: Secondary | ICD-10-CM | POA: Diagnosis not present

## 2023-09-28 MED ORDER — NITROFURANTOIN MACROCRYSTAL 50 MG PO CAPS: ORAL_CAPSULE | ORAL | 1 refills | Status: AC

## 2023-09-28 NOTE — Patient Instructions (Signed)

## 2023-09-29 ENCOUNTER — Encounter: Payer: Self-pay | Admitting: Obstetrics and Gynecology

## 2023-09-29 LAB — CERVICOVAGINAL ANCILLARY ONLY
Chlamydia: NEGATIVE
Comment: NEGATIVE
Comment: NEGATIVE
Comment: NORMAL
Neisseria Gonorrhea: NEGATIVE
Trichomonas: NEGATIVE

## 2023-10-01 LAB — HIV ANTIBODY (ROUTINE TESTING W REFLEX): HIV 1&2 Ab, 4th Generation: NONREACTIVE

## 2023-10-01 LAB — HEPATITIS C RNA QUANTITATIVE
HCV Quantitative Log: <1.18 {Log_IU}/mL
HCV RNA, PCR, QN: <15 [IU]/mL

## 2023-10-01 LAB — HEPATITIS C ANTIBODY: Hepatitis C Ab: REACTIVE — AB

## 2023-10-01 LAB — RPR: RPR Ser Ql: NONREACTIVE
